# Patient Record
Sex: Male | Born: 1965 | Race: White | Hispanic: No | Marital: Married | Smoking: Never smoker
Health system: Southern US, Community
[De-identification: ages and names within clinical notes are randomized; demographics above are authoritative.]

## PROBLEM LIST (undated history)

## (undated) DIAGNOSIS — Z87440 Personal history of urinary (tract) infections: Secondary | ICD-10-CM

## (undated) DIAGNOSIS — J189 Pneumonia, unspecified organism: Secondary | ICD-10-CM

## (undated) DIAGNOSIS — K602 Anal fissure, unspecified: Secondary | ICD-10-CM

## (undated) DIAGNOSIS — I10 Essential (primary) hypertension: Secondary | ICD-10-CM

## (undated) DIAGNOSIS — M810 Age-related osteoporosis without current pathological fracture: Secondary | ICD-10-CM

## (undated) DIAGNOSIS — T7840XA Allergy, unspecified, initial encounter: Secondary | ICD-10-CM

## (undated) DIAGNOSIS — K648 Other hemorrhoids: Secondary | ICD-10-CM

## (undated) DIAGNOSIS — Z87898 Personal history of other specified conditions: Secondary | ICD-10-CM

## (undated) DIAGNOSIS — N200 Calculus of kidney: Secondary | ICD-10-CM

## (undated) DIAGNOSIS — Z6740 Type O blood, Rh positive: Secondary | ICD-10-CM

## (undated) DIAGNOSIS — B019 Varicella without complication: Secondary | ICD-10-CM

## (undated) DIAGNOSIS — E559 Vitamin D deficiency, unspecified: Secondary | ICD-10-CM

## (undated) DIAGNOSIS — Z9289 Personal history of other medical treatment: Secondary | ICD-10-CM

## (undated) HISTORY — DX: Vitamin D deficiency, unspecified: E55.9

## (undated) HISTORY — DX: Anal fissure, unspecified: K60.2

## (undated) HISTORY — DX: Allergy, unspecified, initial encounter: T78.40XA

## (undated) HISTORY — DX: Calculus of kidney: N20.0

## (undated) HISTORY — DX: Type O blood, Rh positive: Z67.40

## (undated) HISTORY — DX: Personal history of urinary (tract) infections: Z87.440

## (undated) HISTORY — DX: Age-related osteoporosis without current pathological fracture: M81.0

## (undated) HISTORY — DX: Personal history of other medical treatment: Z92.89

## (undated) HISTORY — DX: Varicella without complication: B01.9

## (undated) HISTORY — DX: Other hemorrhoids: K64.8

## (undated) HISTORY — DX: Essential (primary) hypertension: I10

## (undated) HISTORY — DX: Personal history of other specified conditions: Z87.898

---

## 2003-02-02 DIAGNOSIS — K602 Anal fissure, unspecified: Secondary | ICD-10-CM

## 2003-02-02 HISTORY — DX: Anal fissure, unspecified: K60.2

## 2004-02-02 HISTORY — PX: ANAL FISTULOTOMY: SHX6423

## 2016-02-02 DIAGNOSIS — E559 Vitamin D deficiency, unspecified: Secondary | ICD-10-CM

## 2016-02-02 DIAGNOSIS — N200 Calculus of kidney: Secondary | ICD-10-CM

## 2016-02-02 HISTORY — DX: Vitamin D deficiency, unspecified: E55.9

## 2016-02-02 HISTORY — DX: Calculus of kidney: N20.0

## 2016-05-25 ENCOUNTER — Encounter (INDEPENDENT_AMBULATORY_CARE_PROVIDER_SITE_OTHER): Payer: Self-pay

## 2016-05-25 ENCOUNTER — Ambulatory Visit (INDEPENDENT_AMBULATORY_CARE_PROVIDER_SITE_OTHER): Payer: 59 | Admitting: Nurse Practitioner

## 2016-05-25 ENCOUNTER — Encounter: Payer: Self-pay | Admitting: Nurse Practitioner

## 2016-05-25 VITALS — BP 152/90 | HR 98 | Ht 70.0 in | Wt 180.4 lb

## 2016-05-25 DIAGNOSIS — K603 Anal fistula: Secondary | ICD-10-CM

## 2016-05-25 MED ORDER — NA SULFATE-K SULFATE-MG SULF 17.5-3.13-1.6 GM/177ML PO SOLN: 1.0000 | Freq: Once | ORAL | 0 refills | Status: AC

## 2016-05-25 MED ORDER — CIPROFLOXACIN HCL 500 MG PO TABS: 500.0000 mg | ORAL_TABLET | Freq: Two times a day (BID) | ORAL | 0 refills | Status: DC

## 2016-05-25 MED ORDER — METRONIDAZOLE 500 MG PO TABS: 500.0000 mg | ORAL_TABLET | Freq: Three times a day (TID) | ORAL | 0 refills | Status: DC

## 2016-05-25 NOTE — Patient Instructions (Addendum)
If you are age 51 or older, your body mass index should be between 23-30. Your Body mass index is 25.88 kg/m. If this is out of the aforementioned range listed, please consider follow up with your Primary Care Provider.  If you are age 66 or younger, your body mass index should be between 19-25. Your Body mass index is 25.88 kg/m. If this is out of the aformentioned range listed, please consider follow up with your Primary Care Provider.   You have been scheduled for a colonoscopy. Please follow written instructions given to you at your visit today.  Please pick up your prep supplies at the pharmacy within the next 1-3 days. If you use inhalers (even only as needed), please bring them with you on the day of your procedure. Your physician has requested that you go to www.startemmi.com and enter the access code given to you at your visit today. This web site gives a general overview about your procedure. However, you should still follow specific instructions given to you by our office regarding your preparation for the procedure.   You have been scheduled for an MRI at 06/05/16 on [facility]. Your appointment time is 1000 am. Please arrive 15 minutes prior to your appointment time for registration purposes. In addition, if you have any metal in your body, have a pacemaker or defibrillator, please be sure to let your ordering physician know. This test typically takes 45 minutes to 1 hour to complete.  We have sent the following medications to your pharmacy for you to pick up at your convenience: Cipro Flagyl  STOP Preparation H.  Follow up appointment 06/15/16 at 3 pm.  Thank you for choosing me and [facility].  Willette Cluster, NP

## 2016-05-25 NOTE — Progress Notes (Addendum)
HPI: Patient is a 51 year old male who recently relocated from [place] to [place]. He is here for evaluation of perianal discomfort and drainage. 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 It is hard for him to recall the details but he did have a colonoscopy around that time as there was a concern for Crohn's disease. It does sound like Crohn's was ultimately excluded, patient had no further problems until just recently. He has had some hemorrhoidal flares off and on but several days ago developed perianal discomfort, itching and mild rectal bleeding. Patient used medicated wipes and Preparation H cream externally but symptoms did not improve. A few days ago while sitting in a meeting patient had a lot of mucoid discharge from the rectum which saturated his pants. Through all of this patient has not had any bowel changes. No constipation. No abdominal pain. Weight is overall stable. No family history of inflammatory bowel disease. Patient has no arthralgias or skin rashes.   Past Medical History:  Diagnosis Date  . Anal fissure     Past Surgical History:  Procedure Laterality Date  . ANAL FISTULOTOMY  2008   Family History  Problem Relation Age of Onset  . Prostate cancer Father   . Diverticulitis Father     part of colon removed  . Hypertension Brother   . Other Maternal Grandmother     died at 49  . Brain cancer Maternal Grandfather   . Diverticulitis Paternal Grandmother   . Colon cancer Neg Hx   . Stomach cancer Neg Hx   . Rectal cancer Neg Hx   . Esophageal cancer Neg Hx   . Liver cancer Neg Hx    Social History  Substance Use Topics  . Smoking status: Never Smoker  . Smokeless tobacco: Never Used  . Alcohol use Yes     Comment: has one drink per week   No current outpatient prescriptions on file.   No current facility-administered medications for this visit.    Allergies    Allergen Reactions  . Penicillins Rash     Review of Systems: All systems reviewed and negative except where noted in HPI.    Physical Exam: BP (!) 152/90   Pulse 98   Ht  (1.778 m)   Wt 180 lb 6 oz (81.8 kg)   SpO2 99%   BMI 25.88 kg/m  Constitutional:  Well-developed, white male male in no acute distress. Psychiatric: Normal mood and affect. Behavior is normal. EENT:  Conjunctivae are normal. No scleral icterus. Neck supple.  Cardiovascular: Normal rate, regular rhythm.  Pulmonary/chest: Effort normal and breath sounds normal. No wheezing, rales or rhonchi. Abdominal: Soft, nondistended, nontender. Bowel sounds active throughout. There are no masses palpable. No hepatomegaly. Rectal: buttocks / perineum with mild erythema /rash. Purulent perianal drainage , most abundant in area of anterior midline. Possibly a small fistula in perineal raphe, couldn't express anything the area . No fluctuance or signs of abscess Extremities: no edema Lymphadenopathy: No cervical adenopathy noted. Neurological: Alert and oriented to person place and time. Skin: Skin is warm and dry. No rashes noted.   ASSESSMENT AND PLAN:  51 year old male with perianal discomfort, scant bleeding,  moderate purulent rectal discharge and possible fistula in perineal raphe. Patient gives a history of a fistulotomy approximately 10 years ago in [place]. Sounds like Crohn's was rule out and he has been asymptomatic until just recently. No associated  bowel changes.  -requesting surgeon's and GI records from [place], hopefully they are still available after 10 years -Atypical presentation but I am still concerned about the possibility of Crohn's,especially given hx of a fistulotomy. Will obtain MRI of pelvis to evaluate for fistula.  -Cipro / flagyl. -Colonoscopy in next 6-8 weeks. Even if MRI negative for fistula and doesn't appear to have Crohn's patient still need colon cancer screening. The risks and  benefits of the procedure were discussed and the patient agrees to proceed.    Willette Cluster, NP  05/25/2016, 11:24 AM  Addendum: Reviewed and agree with initial management. I did see the patient today is patent and the assessment and plan. Beverley Fiedler, MD

## 2016-05-27 ENCOUNTER — Telehealth: Payer: Self-pay | Admitting: Nurse Practitioner

## 2016-05-27 NOTE — Telephone Encounter (Signed)
patient states that he was told to call in with updated past GI info. Dr. Roma Schanz is now with [facility] in [place]. [address] [place] [address] P: [phone]  Patient states that he was prescribed metronidazole and  cipro and he says that he is now having headaches.

## 2016-05-27 NOTE — Telephone Encounter (Signed)
Patient started on his Cipro and Flagyl Wednesday 05/26/16. He has noticed a headache that comes in correlation with the dosing of the antibiotics. He says it is not a bad headache and there are no other symptoms. He asks if he can take Tylenol if he has another headache. Discussed avoiding nsaid's, staying hydrated and pollen allergies. He will try Tylenol. He will call back if he acutely worsens or fails to improve.

## 2016-05-28 NOTE — Addendum Note (Signed)
Addended by: Beverley Fiedler on: 05/28/2016 10:39 AM   Modules accepted: Level of Service

## 2016-05-31 NOTE — Telephone Encounter (Signed)
Beth, CMA Malachi Bonds working on getting records. Can you pass along this information so she knows where to send request? Thanks

## 2016-06-01 ENCOUNTER — Telehealth: Payer: Self-pay | Admitting: Internal Medicine

## 2016-06-01 NOTE — Telephone Encounter (Signed)
Pt states he can have MRI at [facility] for 1600.00 less. Requests order be faxed there to see when they could schedule him. Pt did not want appt at [facility] cancelled until he makes sure he can be scheduled at [facility]. Orders faxed per pt request.

## 2016-06-01 NOTE — Telephone Encounter (Signed)
Called Rosann Auerbach and they states the MRI has auth for [employer] [facility]. MR is currently scheduled at [facility] with the same auth number. Called and left message for pt to call back to see what is actually going on. [facility] states they do not have him scheduled, state they do not schedule the appt until they have a physician's order. Will wait for pt to call back.

## 2016-06-02 NOTE — Telephone Encounter (Signed)
Left message for pt that the orders had been faxed 3 times yesterday but did not go through. Call placed to [facility] and they stated that their fax machine was down and had been for several days. Orders refaxed today and pt aware.

## 2016-06-02 NOTE — Telephone Encounter (Signed)
Patient states that [facility] told him that they do not have orders for this. Please refax. Best # to reach patient [phone]

## 2016-06-05 ENCOUNTER — Ambulatory Visit (HOSPITAL_COMMUNITY): Admission: RE | Admit: 2016-06-05 | Payer: PRIVATE HEALTH INSURANCE | Source: Ambulatory Visit

## 2016-06-08 NOTE — Telephone Encounter (Signed)
OV faxed

## 2016-06-08 NOTE — Telephone Encounter (Signed)
[facility] states that she needs OV notes regarding this. Patient is coming in today. Best P:[phone] and Fax: [phone]

## 2016-06-10 ENCOUNTER — Telehealth: Payer: Self-pay

## 2016-06-10 NOTE — Telephone Encounter (Signed)
Left message for pt to call back regarding MRI results. Per Dr. Rhea BeltonPyrtle MRI does not show abscess or definite fistula. No overt colitis. He feels that pt had an abscess cavity which drained spontaneously and decompressed itself. Colonoscopy is recommended for completeness.

## 2016-06-11 NOTE — Telephone Encounter (Signed)
Spoke with pt and he is aware. Pt has colon scheduled and knows to keep this appt. Pt has OV scheduled for Tuesday and will keep this appt. He states he still has some "moistness in the rectal area." He is wearing a pad but does not really see drainage.

## 2016-06-11 NOTE — Telephone Encounter (Signed)
[facility]. Best call back # [phone].

## 2016-06-15 ENCOUNTER — Ambulatory Visit (INDEPENDENT_AMBULATORY_CARE_PROVIDER_SITE_OTHER): Payer: PRIVATE HEALTH INSURANCE | Admitting: Nurse Practitioner

## 2016-06-15 ENCOUNTER — Encounter: Payer: Self-pay | Admitting: Nurse Practitioner

## 2016-06-15 VITALS — BP 160/94 | HR 80 | Ht 70.0 in | Wt 172.4 lb

## 2016-06-15 DIAGNOSIS — K610 Anal abscess: Secondary | ICD-10-CM

## 2016-06-15 NOTE — Patient Instructions (Signed)
If you are age 51 or older, your body mass index should be between 23-30. Your Body mass index is 24.73 kg/m. If this is out of the aforementioned range listed, please consider follow up with your Primary Care Provider.  If you are age 51 or younger, your body mass index should be between 19-25. Your Body mass index is 24.73 kg/m. If this is out of the aformentioned range listed, please consider follow up with your Primary Care Provider.   Keep colonoscopy appointment on 07/02/16 at 200 pm.  Thank you for choosing me and [facility].  Willette ClusterPaula Guenther, NP

## 2016-06-18 ENCOUNTER — Ambulatory Visit: Payer: Self-pay | Admitting: Gastroenterology

## 2016-06-18 NOTE — Progress Notes (Addendum)
     HPI: Patient is a 51 yo male who I saw late April for possible perianal fistula. He gave a history of a perianal fistula requiring a fist[place] 10 [place] ago [place]. We were unable to obtain GI records from [place] and patient didn't have any luck obtaining them either. When I saw him patient complained of perianal discomfort with recent episode of significant amount of purulent looking drainage which seemed to be coming from a small perianal lesion. On exam he did in fact have purulent looking perianal drainage coming from a tiny area in perineal raphe. He had no rectal bleeding, bowel changes, abdominal pain or fevers. We obtained an MRI of the pelvis which was actually didn't show a fistula but did some some thickening at 11 o'clock at level of introitus. While awaiting the MRI he was treated with cipro / flagyl and is here for follow up.   Alinda Moneyony feels the antibiotics have definitely helped though the drainage hasn't totally resolved. He still notices dampness in perianal area during the day. The drainage doesn't really have a color nor is it malodorous. No rectal pain and bowels are still moving normally.  No skin rashes or arthralgias. No urinary symptoms    Past Medical History:  Diagnosis Date  . Anal fissure     Patient's surgical history, family medical history, social history, medications and allergies were all reviewed in Epic    Physical Exam: BP (!) 160/94   Pulse 80   Ht 5\' 10"  (1.778 m)   Wt 172 lb 6 oz (78.2 kg)   BMI 24.73 kg/m   GENERAL: pleasant well developed white male in NAD PSYCH: :Pleasant, cooperative, normal affect EENT:conjunctiva pink, mucous membranes moist, neck supple without masses CARDIAC:  RRR, no peripheral edema PULM: Normal respiratory effort ABDOMEN:  soft, nontender, nondistended, no obvious masses,   normal bowel sounds RECTAL: no purulent drainage. No perianal fullness / fluctuance. He is still significantly tender at 12  o' clock. There is a small, shallow cratered area there which is non-draining.  SKIN:  turgor, no lesions seen Musculoskeletal:  Normal muscle tone, normal strength NEURO: Alert and oriented x 3, no focal neurologic deficits    ASSESSMENT and PLAN:  521. 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 He described a fist[place]otomy 10 [place] ago in [place]. Sounds like Crohn's was ultimately excluded at that time.  Recent MRI of pelvis didn't demonstrate a fistula, only an area of thickening at 11 o'clock at the level of the introitus. It is possible that patient had a perianal abscess which spontaneously ruptured a few days prior to seeing me. The drainage has significantly improved though not resolved after a course of cipro and flagyl. He is still quite tender in the same area in perineal raphe. Bowel movements remain completely normal. At this point Crohn's seems unlikely -At this point we will proceed with the scheduled colonoscopy for colon cancer screening and for further evaluation of his ongoing symptoms. The risks and benefits of the procedure were discussed and the patient agrees to proceed.  -Patient will call in the interim if he develops fevers / chills or increasing perianal discomfort or swelling -We will continue to try and get [place]   Willette ClusterPaula Lorris Carducci , NP 06/18/2016, 11:28 PM   Addendum: Reviewed and agree with management. Pyrtle, Carie CaddyJay M, MD

## 2016-06-24 ENCOUNTER — Encounter: Payer: Self-pay | Admitting: Internal Medicine

## 2016-07-02 ENCOUNTER — Ambulatory Visit (AMBULATORY_SURGERY_CENTER): Payer: PRIVATE HEALTH INSURANCE | Admitting: Internal Medicine

## 2016-07-02 ENCOUNTER — Telehealth: Payer: Self-pay | Admitting: Internal Medicine

## 2016-07-02 ENCOUNTER — Encounter: Payer: Self-pay | Admitting: Internal Medicine

## 2016-07-02 VITALS — BP 138/89 | HR 79 | Temp 98.9°F | Resp 11 | Ht 70.0 in | Wt 172.0 lb

## 2016-07-02 DIAGNOSIS — Z1211 Encounter for screening for malignant neoplasm of colon: Secondary | ICD-10-CM | POA: Diagnosis present

## 2016-07-02 DIAGNOSIS — Z1212 Encounter for screening for malignant neoplasm of rectum: Secondary | ICD-10-CM | POA: Diagnosis not present

## 2016-07-02 HISTORY — PX: COLONOSCOPY: SHX174

## 2016-07-02 MED ORDER — SODIUM CHLORIDE 0.9 % IV SOLN: 500.0000 mL | INTRAVENOUS | Status: DC

## 2016-07-02 NOTE — Progress Notes (Signed)
To recovery, report to Jones, RN, VSS 

## 2016-07-02 NOTE — Telephone Encounter (Signed)
Spoke with patient and explained that he needs to mix prep with water.  Suggested that he Take sips of sprite or another clear liquid while taking the prep.  Also suggested that he use a straw.  He also plans to start before the start time on his instructions to give himself more time.

## 2016-07-02 NOTE — Progress Notes (Signed)
Pt's states no medical or surgical changes since previsit or office visit.  No egg or soy allergy  

## 2016-07-02 NOTE — Patient Instructions (Signed)
YOU HAD AN ENDOSCOPIC PROCEDURE TODAY AT THE [facility]:   Refer to the procedure report that was given to you for any specific questions about what was found during the examination.  If the procedure report does not answer your questions, please call your gastroenterologist to clarify.  If you requested that your care partner not be given the details of your procedure findings, then the procedure report has been included in a sealed envelope for you to review at your convenience later.  YOU SHOULD EXPECT: Some feelings of bloating in the abdomen. Passage of more gas than usual.  Walking can help get rid of the air that was put into your GI tract during the procedure and reduce the bloating. If you had a lower endoscopy (such as a colonoscopy or flexible sigmoidoscopy) you may notice spotting of blood in your stool or on the toilet paper. If you underwent a bowel prep for your procedure, you may not have a normal bowel movement for a few days.  Please Note:  You might notice some irritation and congestion in your nose or some drainage.  This is from the oxygen used during your procedure.  There is no need for concern and it should clear up in a day or so.  SYMPTOMS TO REPORT IMMEDIATELY:   Following lower endoscopy (colonoscopy or flexible sigmoidoscopy):  Excessive amounts of blood in the stool  Significant tenderness or worsening of abdominal pains  Swelling of the abdomen that is new, acute  Fever of 100F or higher  For urgent or emergent issues, a gastroenterologist can be reached at any hour by calling [phone].   DIET:  We do recommend a small meal at first, but then you may proceed to your regular diet.  Drink plenty of fluids but you should avoid alcoholic beverages for 24 hours.  ACTIVITY:  You should plan to take it easy for the rest of today and you should NOT DRIVE or use heavy machinery until tomorrow (because of the sedation medicines used during the test).     FOLLOW UP: Our staff will call the number listed on your records the next business day following your procedure to check on you and address any questions or concerns that you may have regarding the information given to you following your procedure. If we do not reach you, we will leave a message.  However, if you are feeling well and you are not experiencing any problems, there is no need to return our call.  We will assume that you have returned to your regular daily activities without incident.  If any biopsies were taken you will be contacted by phone or by letter within the next 1-3 weeks.  Please call us at [phone] if you have not heard about the biopsies in 3 weeks.   Repeat Colonoscopy screening in 10 years.   SIGNATURES/CONFIDENTIALITY: You and/or your care partner have signed paperwork which will be entered into your electronic medical record.  These signatures attest to the fact that that the information above on your After Visit Summary has been reviewed and is understood.  Full responsibility of the confidentiality of this discharge information lies with you and/or your care-partner. 

## 2016-07-02 NOTE — Op Note (Signed)
[facility] Patient Name: Alejandro English Procedure Date: 07/02/2016 3:08 PM MRN: [id number] Endoscopist: Beverley Fiedler , MD Age: 51 Referring MD:  Date of Birth: 1965/02/11 Gender: Male Account #: [id number] Procedure:                Colonoscopy Indications:              Screening for colorectal malignant neoplasm,                            history of rectal perirectal abscess treated to                            resolution with antibiotics, history of perirectal                            infection and fistulotomy approximately 10 yrs ago                            in [place] Medicines:                Monitored Anesthesia Care Procedure:                Pre-Anesthesia Assessment:                           - Prior to the procedure, a History and Physical                            was performed, and patient medications and                            allergies were reviewed. The patient's tolerance of                            previous anesthesia was also reviewed. The risks                            and benefits of the procedure and the sedation                            options and risks were discussed with the patient.                            All questions were answered, and informed consent                            was obtained. Prior Anticoagulants: The patient has                            taken no previous anticoagulant or antiplatelet                            agents. ASA Grade Assessment: II - A patient with  mild systemic disease. After reviewing the risks                            and benefits, the patient was deemed in                            satisfactory condition to undergo the procedure.                           After obtaining informed consent, the colonoscope                            was passed under direct vision. Throughout the                            procedure, the patient's blood pressure, pulse, and            oxygen saturations were monitored continuously. The                            Colonoscope was introduced through the anus and                            advanced to the the terminal ileum. The colonoscopy                            was performed without difficulty. The patient                            tolerated the procedure well. The quality of the                            bowel preparation was good. The terminal ileum,                            ileocecal valve, appendiceal orifice, and rectum                            were photographed. Scope In: 3:29:13 PM Scope Out: 3:41:53 PM Scope Withdrawal Time: 0 hours 8 minutes 0 seconds  Total Procedure Duration: 0 hours 12 minutes 40 seconds  Findings:                 The terminal ileum appeared normal.                           The entire examined colon appeared normal on direct                            and retroflexion views.                           There is no endoscopic evidence of inflammatory                            bowel disease  in the examined terminal ileum or the                            entire colon. Complications:            No immediate complications. Estimated Blood Loss:     Estimated blood loss: none. Estimated blood loss:                            none. Impression:               - The examined portion of the ileum was normal.                           - The entire examined colon is normal on direct and                            retroflexion views.                           - No evidence of IBD.                           - No specimens collected. Recommendation:           - Patient has a contact number available for                            emergencies. The signs and symptoms of potential                            delayed complications were discussed with the                            patient. Return to normal activities tomorrow.                            Written discharge instructions were  provided to the                            patient.                           - Resume previous diet.                           - Continue present medications.                           - Repeat colonoscopy in 10 years for screening                            purposes. [name], MD 07/02/2016 3:50:41 PM This report has been signed electronically.

## 2016-07-05 ENCOUNTER — Telehealth: Payer: Self-pay | Admitting: *Deleted

## 2016-07-05 NOTE — Telephone Encounter (Signed)
Left message on f/u call 

## 2016-07-05 NOTE — Telephone Encounter (Signed)
  Follow up Call-  Call back number 07/02/2016  Post procedure Call Back phone  # [phone]  Permission to leave phone message Yes     Patient questions:  Do you have a fever, pain , or abdominal swelling? No. Pain Score  0 *  Have you tolerated food without any problems? Yes.    Have you been able to return to your normal activities? Yes.    Do you have any questions about your discharge instructions: Diet   No. Medications  No. Follow up visit  No.  Do you have questions or concerns about your Care? No.  Actions: * If pain score is 4 or above: No action needed, pain <4.

## 2016-07-09 ENCOUNTER — Telehealth: Payer: Self-pay | Admitting: *Deleted

## 2016-07-13 NOTE — Telephone Encounter (Signed)
No answer. Left message to call if questions or concerns. 

## 2016-08-01 LAB — HM COLONOSCOPY

## 2016-09-17 ENCOUNTER — Encounter: Payer: Self-pay | Admitting: Physician Assistant

## 2016-09-17 ENCOUNTER — Telehealth: Payer: Self-pay | Admitting: Internal Medicine

## 2016-09-17 ENCOUNTER — Ambulatory Visit (INDEPENDENT_AMBULATORY_CARE_PROVIDER_SITE_OTHER): Payer: 59 | Admitting: Physician Assistant

## 2016-09-17 ENCOUNTER — Encounter (INDEPENDENT_AMBULATORY_CARE_PROVIDER_SITE_OTHER): Payer: Self-pay

## 2016-09-17 VITALS — BP 170/100 | HR 76 | Ht 69.5 in | Wt 178.4 lb

## 2016-09-17 DIAGNOSIS — R198 Other specified symptoms and signs involving the digestive system and abdomen: Secondary | ICD-10-CM | POA: Diagnosis not present

## 2016-09-17 DIAGNOSIS — K648 Other hemorrhoids: Secondary | ICD-10-CM | POA: Diagnosis not present

## 2016-09-17 MED ORDER — METRONIDAZOLE 500 MG PO TABS: 500.0000 mg | ORAL_TABLET | Freq: Three times a day (TID) | ORAL | 0 refills | Status: DC

## 2016-09-17 MED ORDER — CIPROFLOXACIN HCL 500 MG PO TABS: 500.0000 mg | ORAL_TABLET | Freq: Two times a day (BID) | ORAL | 0 refills | Status: DC

## 2016-09-17 MED ORDER — HYDROCORTISONE ACETATE 25 MG RE SUPP: 25.0000 mg | Freq: Two times a day (BID) | RECTAL | 1 refills | Status: DC

## 2016-09-17 NOTE — Progress Notes (Addendum)
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   Please recall patient was initially seen 05/25/16 with history of a fistulotomy 10 years prior, he was treated with Ciprofloxacin and Flagyl and a pelvic MRI was ordered. Patient had follow-up 06/15/16 and had some improvement on the Ciprofloxacin and Flagyl. The MRI had showed no fistula but some thickening at "11:00 at level of introitus". Patient reported antibiotics helped the drainage but it had not resolved. At that time patient was scheduled for a colonoscopy. Patient's previous records from [place] had not been able to be obtained.   Patient had colonoscopy 07/02/16 by Dr. Rhea Belton, this was normal.   Today, the patient presents to clinic and tells me that he was doing well after time of his colonoscopy until Tuesday of this week, 09/14/16 when he noticed some drainage of a clear brown-ish looking material, this was better on Wednesday and was worse again yesterday and is better today. He has also had some discomfort as far as itchiness in his rectum. Patient does tell me today that he is a runner and did have a long run on Wednesday. He typically runs 3 days a week and will ride his bike in between. No obvious rectal pain or rectal bleeding.   Patient denies fever, chills, change in bowel habits, anorexia, nausea, vomiting or symptoms that awaken him at night.  Past Medical History:  Diagnosis Date  . Anal fissure     Past Surgical History:  Procedure Laterality Date  . ANAL FISTULOTOMY  2008    No current outpatient prescriptions on file.   Current Facility-Administered Medications  Medication Dose Route Frequency Provider Last Rate Last Dose  . 0.9 %  sodium chloride infusion  500 mL Intravenous Continuous Pyrtle, Carie Caddy, MD        Allergies as of 09/17/2016 - Review  Complete 07/02/2016  Allergen Reaction Noted  . Penicillins Rash 05/25/2016    Family History  Problem Relation Age of Onset  . Prostate cancer Father   . Diverticulitis Father        part of colon removed  . Hypertension Brother   . Other Maternal Grandmother        died at 79  . Brain cancer Maternal Grandfather   . Diverticulitis Paternal Grandmother   . Colon cancer Neg Hx   . Stomach cancer Neg Hx   . Rectal cancer Neg Hx   . Esophageal cancer Neg Hx   . Liver cancer Neg Hx   . Colon polyps Neg Hx     Social History   Social History  . Marital status: Single    Spouse name: N/A  . Number of children: 2  . Years of education: N/A   Occupational History  . Geologist, engineering    Social History Main Topics  . Smoking status: Never Smoker  . Smokeless tobacco: Never Used  . Alcohol use Yes     Comment: has one drink per week  . Drug use: No  . Sexual activity: Not on file   Other Topics Concern  . Not on file   Social History Narrative  . No narrative on file    Review of Systems:    Constitutional: No weight loss, fever or chills Cardiovascular: No chest pain Respiratory: No SOB Gastrointestinal: See HPI and otherwise negative  Physical Exam:  Vital signs: BP (!) 170/100 (BP Location: Left Arm, Patient Position: Sitting, Cuff Size: Normal)   Pulse 76   Ht 5' 9.5" (1.765 m) Comment: height measured without shoes  Wt 178 lb 6 oz (80.9 kg)   BMI 25.96 kg/m    Constitutional:   Pleasant Caucasian male appears to be in NAD, Well developed, Well nourished, alert and cooperative Head:  Normocephalic and atraumatic. Eyes:   PEERL, EOMI. No icterus. Conjunctiva pink. Ears:  Normal auditory acuity. Neck:  Supple Throat: Oral cavity and pharynx without inflammation, swelling or lesion.  Respiratory: Respirations even and unlabored. Lungs clear to auscultation bilaterally.   No wheezes, crackles, or rhonchi.  Cardiovascular: Normal S1, S2. No MRG. Regular  rate and rhythm. No peripheral edema, cyanosis or pallor.  Gastrointestinal:  Soft, nondistended, nontender. No rebound or guarding. Normal bowel sounds. No appreciable masses or hepatomegaly. Rectal:  External exam: Moisture and some erythema around the rectum; Anoscopy: Patient tender with insertion of scope, inflamed internal hemorrhoids, grade 1/2, no sign of abscess and no pinpoint tenderness, no fistula, no drainage Psychiatric:  Demonstrates good judgement and reason without abnormal affect or behaviors.  No recent labs or imaging.  Assessment: 1. Internal hemorrhoids: Seen at time of exam today, inflamed, question whether drainage is just fecal seepage 2. 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 Discussed with patient today that symptoms of drainage could be stool seepage due to inflamed hemorrhoids. Recommend that we treat these first. 2. Prescribed Hydrocortisone suppositories twice a day 7 days #14 with one refill 3. The patient is allergic to penicillins. Sent in an emergency script for ciprofloxacin 500 mg twice a day 10 days and Flagyl 500 mg 3 times a day 10 days. The patient was advised not to start these antibiotics unless his symptoms increase or worsen i.e. he has rectal bleeding, increased rectal pain, increased drainage, fever or chills 4. Reviewed anti-hemorrhoid measures. Recommended sitz baths 2-3 times daily 5. Patient should keep stool solid and formed 6. Patient to follow in clinic in 2-3 weeks with an APP or Dr. Marlan Palau, PA-C [facility] 09/17/2016, 2:02 PM  Cc: No ref. provider found   Addendum: Reviewed and agree with management. Pyrtle, Carie Caddy, MD

## 2016-09-17 NOTE — Telephone Encounter (Signed)
Pt states he started having rectal drainage again on Tuesday. Reports Thursday his rectal area felt raw and itchy. Pt was treated for rectal abscess a few months ago.Pt scheduled to see Hyacinth Meeker PA today at 2:15pm. Pt aware of appt.

## 2016-09-17 NOTE — Patient Instructions (Addendum)
If you are age 51 or older, your body mass index should be between 23-30. Your Body mass index is 25.96 kg/m. If this is out of the aforementioned range listed, please consider follow up with your Primary Care Provider.  If you are age 76 or younger, your body mass index should be between 19-25. Your Body mass index is 25.96 kg/m. If this is out of the aformentioned range listed, please consider follow up with your Primary Care Provider.   We have sent the following medications to your pharmacy for you to pick up at your convenience: Anusol Flagyl Cipro  DO NOT start antibiotics unless symptoms worsen ie. Rectal bleeding, increased pain, fever/chills.   How to Take a Sitz Bath A sitz bath is a warm water bath that is taken while you are sitting down. The water should only come up to your hips and should cover your buttocks. Your health care provider may recommend a sitz bath to help you:  Clean the lower part of your body, including your genital area.  With itching.  With pain.  With sore muscles or muscles that tighten or spasm.  How to take a sitz bath Take 3-4 sitz baths per day or as told by your health care provider. 1. Partially fill a bathtub with warm water. You will only need the water to be deep enough to cover your hips and buttocks when you are sitting in it. 2. If your health care provider told you to put medicine in the water, follow the directions exactly. 3. Sit in the water and open the tub drain a little. 4. Turn on the warm water again to keep the tub at the correct level. Keep the water running constantly. 5. Soak in the water for 15-20 minutes or as told by your health care provider. 6. After the sitz bath, pat the affected area dry first. Do not rub it. 7. Be careful when you stand up after the sitz bath because you may feel dizzy.  Contact a health care provider if:  Your symptoms get worse. Do not continue with sitz baths if your symptoms get worse.  You  have new symptoms. Do not continue with sitz baths until you talk with your health care provider. This information is not intended to replace advice given to you by your health care provider. Make sure you discuss any questions you have with your health care provider. Document Released: 10/11/2003 Document Revised: 06/18/2015 Document Reviewed: 01/16/2014 Elsevier Interactive Patient Education  2018 [employer].  Follow up with Dr. Rhea Belton on 11/17/16 at 8:30 a.m.  Thank you for choosing me and [facility].   Hyacinth Meeker, PA-C

## 2016-11-05 ENCOUNTER — Encounter: Payer: Self-pay | Admitting: *Deleted

## 2016-11-17 ENCOUNTER — Ambulatory Visit: Payer: 59 | Admitting: Internal Medicine

## 2016-12-01 ENCOUNTER — Telehealth: Payer: Self-pay

## 2016-12-01 NOTE — Telephone Encounter (Signed)
Unable to reach patient at time of Pre Visit Call.   

## 2016-12-02 ENCOUNTER — Encounter: Payer: Self-pay | Admitting: Family Medicine

## 2016-12-02 ENCOUNTER — Ambulatory Visit (INDEPENDENT_AMBULATORY_CARE_PROVIDER_SITE_OTHER): Payer: 59 | Admitting: Family Medicine

## 2016-12-02 VITALS — BP 160/92 | HR 72 | Temp 98.7°F | Resp 20 | Ht 70.0 in | Wt 184.8 lb

## 2016-12-02 DIAGNOSIS — Z23 Encounter for immunization: Secondary | ICD-10-CM

## 2016-12-02 DIAGNOSIS — I10 Essential (primary) hypertension: Secondary | ICD-10-CM

## 2016-12-02 DIAGNOSIS — Z6826 Body mass index (BMI) 26.0-26.9, adult: Secondary | ICD-10-CM | POA: Diagnosis not present

## 2016-12-02 DIAGNOSIS — Z125 Encounter for screening for malignant neoplasm of prostate: Secondary | ICD-10-CM

## 2016-12-02 DIAGNOSIS — Z7689 Persons encountering health services in other specified circumstances: Secondary | ICD-10-CM

## 2016-12-02 DIAGNOSIS — E663 Overweight: Secondary | ICD-10-CM | POA: Insufficient documentation

## 2016-12-02 HISTORY — DX: Essential (primary) hypertension: I10

## 2016-12-02 LAB — CBC
HCT: 51 % (ref 39.0–52.0)
HEMOGLOBIN: 17.2 g/dL — AB (ref 13.0–17.0)
MCHC: 33.7 g/dL (ref 30.0–36.0)
MCV: 88.2 fl (ref 78.0–100.0)
PLATELETS: 166 10*3/uL (ref 150.0–400.0)
RBC: 5.79 Mil/uL (ref 4.22–5.81)
RDW: 13 % (ref 11.5–15.5)
WBC: 6.6 10*3/uL (ref 4.0–10.5)

## 2016-12-02 LAB — COMPREHENSIVE METABOLIC PANEL
ALBUMIN: 4.6 g/dL (ref 3.5–5.2)
ALT: 17 U/L (ref 0–53)
AST: 17 U/L (ref 0–37)
Alkaline Phosphatase: 69 U/L (ref 39–117)
BILIRUBIN TOTAL: 0.7 mg/dL (ref 0.2–1.2)
BUN: 16 mg/dL (ref 6–23)
CALCIUM: 9.8 mg/dL (ref 8.4–10.5)
CHLORIDE: 104 meq/L (ref 96–112)
CO2: 33 mEq/L — ABNORMAL HIGH (ref 19–32)
CREATININE: 1.02 mg/dL (ref 0.40–1.50)
GFR: 81.79 mL/min (ref >60.00)
Glucose, Bld: 76 mg/dL (ref 70–99)
Potassium: 4.6 mEq/L (ref 3.5–5.1)
Sodium: 142 mEq/L (ref 135–145)
Total Protein: 7.5 g/dL (ref 6.0–8.3)

## 2016-12-02 LAB — TSH: TSH: 1.91 u[IU]/mL (ref 0.35–4.50)

## 2016-12-02 LAB — PSA: PSA: 2.01 ng/mL (ref 0.10–4.00)

## 2016-12-02 LAB — HEMOGLOBIN A1C: HEMOGLOBIN A1C: 5.2 % (ref 4.6–6.5)

## 2016-12-02 MED ORDER — ZOSTER VAC RECOMB ADJUVANTED 50 MCG/0.5ML IM SUSR
0.5000 mL | Freq: Once | INTRAMUSCULAR | 1 refills | Status: AC
Start: 2016-12-02 — End: 2016-12-02

## 2016-12-02 NOTE — Progress Notes (Signed)
Patient ID: [name], male  DOB: [birth year], 51 y.o.   MRN: [id number] Patient Care Team    Relationship Specialty Notifications Start End  Ma Hillock, DO PCP - General Family Medicine  12/02/16     Chief Complaint  Patient presents with  . Establish Care    Subjective:  Sanad Fearnow is a 51 y.o.  male present for new patient establishment. All past medical history, surgical history, allergies, family history, immunizations, medications and social history were obtained and entered in the electronic medical record today. All recent labs, ED visits and hospitalizations within the last year were reviewed.  Patient presents for new patient establishment, recently moved from [place].  Elevated blood pressure: Patient reports he has had borderline blood pressure for the past few years. However when he had his colonoscopy and follow-up his blood pressure have been rather elevated and he was encouraged to be established with a PCP. He denies chest pain, shortness breath, lower extremity edema, dizziness or headaches. He does not monitor the salt content in his diet. He reports he is a daily runner, but has cut back over the last few weeks to months secondary to a mild back strain.   Depression screen PHQ 2/9 12/02/2016  Decreased Interest 0  Down, Depressed, Hopeless 0  PHQ - 2 Score 0   No flowsheet data found.    Current Exercise Habits: The patient does not participate in regular exercise at present Exercise limited by: orthopedic condition(s) Fall Risk  12/02/2016  Falls in the past year? No     Immunization History  Administered Date(s) Administered  . Influenza,inj,Quad PF,6+ Mos 12/02/2016    No exam data present  Past Medical History:  Diagnosis Date  . Allergy   . Anal fissure 2005  . Chickenpox   . H/O syncope   . History of UTI   . Internal hemorrhoids    follow by  Dr. Hilarie Fredrickson   Allergies  Allergen Reactions  . Penicillins Rash   Past  Surgical History:  Procedure Laterality Date  . ANAL FISTULOTOMY  2008  . COLONOSCOPY  07/02/2016   Normal, 10 year follow-up. Dr. Hilarie Fredrickson   Family History  Problem Relation Age of Onset  . Prostate cancer Father   . Diverticulitis Father        part of colon removed  . Hypertension Brother   . Other Maternal Grandmother        died at 34  . Brain cancer Maternal Grandfather   . Diverticulitis Paternal Grandmother   . Colon cancer Neg Hx   . Stomach cancer Neg Hx   . Rectal cancer Neg Hx   . Esophageal cancer Neg Hx   . Liver cancer Neg Hx   . Colon polyps Neg Hx    Social History   Social History  . Marital status: Single    Spouse name: N/A  . Number of children: 2  . Years of education: 67   Occupational History  . Artist    Social History Main Topics  . Smoking status: Never Smoker  . Smokeless tobacco: Never Used  . Alcohol use Yes     Comment: has one drink per week  . Drug use: No  . Sexual activity: No   Other Topics Concern  . Not on file   Social History Narrative   Single. Has 2 children. Works as a Pharmacist, hospital.   MBA degree.   Wears a bicycle helmet. Wears his  seatbelt.   Exercises routinely.      Allergies as of 12/02/2016      Reactions   Penicillins Rash      Medication List       Accurate as of 12/02/16  1:03 PM. Always use your most recent med list.          Zoster Vaccine Adjuvanted injection Commonly known as:  SHINGRIX Inject 0.5 mLs into the muscle once. , repeat injection in 2-6 months.       All past medical history, surgical history, allergies, family history, immunizations andmedications were updated in the EMR today and reviewed under the history and medication portions of their EMR.    No results found for this or any previous visit (from the past 2160 hour(s)).  Patient was never admitted.   ROS: 14 pt review of systems performed and negative (unless mentioned in an HPI)  Objective: BP (!) 160/92  (BP Location: Right Arm, Patient Position: Sitting, Cuff Size: Large) Comment (Cuff Size): manual  Pulse 72   Temp 98.7 F (37.1 C)   Resp 20   Ht '5\' 10"'$  (1.778 m)   Wt 184 lb 12 oz (83.8 kg)   SpO2 96%   BMI 26.51 kg/m  Gen: Afebrile. No acute distress. Nontoxic in appearance, well-developed, well-nourished,  pleasant Caucasian male. HENT: AT. [place]. Bilateral TM visualized and normal in appearance, normal external auditory canal. MMM, no oral lesions, adequate dentition. Bilateral nares within normal limits. Throat without erythema, ulcerations or exudates. no Cough on exam, no hoarseness on exam. Eyes:Pupils Equal Round Reactive to light, Extraocular movements intact,  Conjunctiva without redness, discharge or icterus. Neck/lymp/endocrine: Supple, no lymphadenopathy, no thyromegaly CV: RRR no murmur, no edema, +2/4 P posterior tibialis pulses. No carotid bruits. No JVD. Chest: CTAB, no wheeze, rhonchi or crackles. Normal Respiratory effort. Could Air movement. Abd: Soft. Flat. NTND. BS present. No Masses palpated. No hepatosplenomegaly.  Skin:  Warm and well-perfused. Skin intact. Neuro/Msk: Normal gait. PERLA. EOMi. Alert. Oriented x3.   Psych: Normal affect, dress and demeanor. Normal speech. Normal thought content and judgment.   Assessment/plan: Evaristo Tsuda is a 51 y.o. male present for establish care with elevated BP.  Influenza vaccine administered - Flu Vaccine QUAD 6+ mos PF IM (Fluarix Quad PF) Essential hypertension/BMI 26 - New-onset hypertension. Education was provided today on normal parameters of blood pressure. Discussed the need of medication bring down his blood pressure. Patient wishes he did not need to medication, but understands and is agreeable. - No laboratory results available in the system, we'll collect labs today for baseline kidney function, CBC, check for thyroid disorder causing hypertension and diabetes screening for elevated blood pressure. - We'll start  lisinopril 10 mg daily and follow-up in 2 weeks, after kidney function is received. - Discussed exercise and low-sodium diet in detail. Patient was given additional educational and low-sodium diet and hypertension. - CBC - Comp Met (CMET) - TSH - HgB A1c - Follow-up 2 weeks  Screening for prostate cancer - Family history, patient would like to have screening. No prior screening available for comparison. - PSA   Return in about 2 weeks (around 12/16/2016) for HTN'.  Greater than 45 minutes was spent with patient, greater than 50% of that time was spent face-to-face with patient counseling for new onset hypertension, and establishment of care/chart for patient.   Note is dictated utilizing voice recognition software. Although note has been proof read prior to signing, occasional typographical errors still can be  missed. If any questions arise, please do not hesitate to call for verification.  Electronically signed by: Howard Pouch, DO [place]

## 2016-12-02 NOTE — Patient Instructions (Signed)
Nice to meet you today.   We will call you with your labs once available. We will likely start lisinopril 10 mg daily to start for your blood pressure once labs are resulted. We will need to follow up with you in 2 weeks to recheck BP with a provider appt.     Low sodium diet encouraged.  Exercise > 150 minutes a week.     Hypertension Hypertension, commonly called high blood pressure, is when the force of blood pumping through the arteries is too strong. The arteries are the blood vessels that carry blood from the heart throughout the body. Hypertension forces the heart to work harder to pump blood and may cause arteries to become narrow or stiff. Having untreated or uncontrolled hypertension can cause heart attacks, strokes, kidney disease, and other problems. A blood pressure reading consists of a higher number over a lower number. Ideally, your blood pressure should be below 120/80. The first ("top") number is called the systolic pressure. It is a measure of the pressure in your arteries as your heart beats. The second ("bottom") number is called the diastolic pressure. It is a measure of the pressure in your arteries as the heart relaxes. What are the causes? The cause of this condition is not known. What increases the risk? Some risk factors for high blood pressure are under your control. Others are not. Factors you can change  Smoking.  Having type 2 diabetes mellitus, high cholesterol, or both.  Not getting enough exercise or physical activity.  Being overweight.  Having too much fat, sugar, calories, or salt (sodium) in your diet.  Drinking too much alcohol. Factors that are difficult or impossible to change  Having chronic kidney disease.  Having a family history of high blood pressure.  Age. Risk increases with age.  Race. You may be at higher risk if you are African-American.  Gender. Men are at higher risk than women before age 38. After age 64, women are at  higher risk than men.  Having obstructive sleep apnea.  Stress. What are the signs or symptoms? Extremely high blood pressure (hypertensive crisis) may cause:  Headache.  Anxiety.  Shortness of breath.  Nosebleed.  Nausea and vomiting.  Severe chest pain.  Jerky movements you cannot control (seizures).  How is this diagnosed? This condition is diagnosed by measuring your blood pressure while you are seated, with your arm resting on a surface. The cuff of the blood pressure monitor will be placed directly against the skin of your upper arm at the level of your heart. It should be measured at least twice using the same arm. Certain conditions can cause a difference in blood pressure between your right and left arms. Certain factors can cause blood pressure readings to be lower or higher than normal (elevated) for a short period of time:  When your blood pressure is higher when you are in a health care provider's office than when you are at home, this is called white coat hypertension. Most people with this condition do not need medicines.  When your blood pressure is higher at home than when you are in a health care provider's office, this is called masked hypertension. Most people with this condition may need medicines to control blood pressure.  If you have a high blood pressure reading during one visit or you have normal blood pressure with other risk factors:  You may be asked to return on a different day to have your blood pressure checked again.  You may be asked to monitor your blood pressure at home for 1 week or longer.  If you are diagnosed with hypertension, you may have other blood or imaging tests to help your health care provider understand your overall risk for other conditions. How is this treated? This condition is treated by making healthy lifestyle changes, such as eating healthy foods, exercising more, and reducing your alcohol intake. Your health care provider  may prescribe medicine if lifestyle changes are not enough to get your blood pressure under control, and if:  Your systolic blood pressure is above 130.  Your diastolic blood pressure is above 80.  Your personal target blood pressure may vary depending on your medical conditions, your age, and other factors. Follow these instructions at home: Eating and drinking  Eat a diet that is high in fiber and potassium, and low in sodium, added sugar, and fat. An example eating plan is called the DASH (Dietary Approaches to Stop Hypertension) diet. To eat this way: ? Eat plenty of fresh fruits and vegetables. Try to fill half of your plate at each meal with fruits and vegetables. ? Eat whole grains, such as whole wheat pasta, brown rice, or whole grain bread. Fill about one quarter of your plate with whole grains. ? Eat or drink low-fat dairy products, such as skim milk or low-fat yogurt. ? Avoid fatty cuts of meat, processed or cured meats, and poultry with skin. Fill about one quarter of your plate with lean proteins, such as fish, chicken without skin, beans, eggs, and tofu. ? Avoid premade and processed foods. These tend to be higher in sodium, added sugar, and fat.  Reduce your daily sodium intake. Most people with hypertension should eat less than 1,500 mg of sodium a day.  Limit alcohol intake to no more than 1 drink a day for nonpregnant women and 2 drinks a day for men. One drink equals 12 oz of beer, 5 oz of wine, or 1 oz of hard liquor. Lifestyle  Work with your health care provider to maintain a healthy body weight or to lose weight. Ask what an ideal weight is for you.  Get at least 30 minutes of exercise that causes your heart to beat faster (aerobic exercise) most days of the week. Activities may include walking, swimming, or biking.  Include exercise to strengthen your muscles (resistance exercise), such as pilates or lifting weights, as part of your weekly exercise routine. Try to  do these types of exercises for 30 minutes at least 3 days a week.  Do not use any products that contain nicotine or tobacco, such as cigarettes and e-cigarettes. If you need help quitting, ask your health care provider.  Monitor your blood pressure at home as told by your health care provider.  Keep all follow-up visits as told by your health care provider. This is important. Medicines  Take over-the-counter and prescription medicines only as told by your health care provider. Follow directions carefully. Blood pressure medicines must be taken as prescribed.  Do not skip doses of blood pressure medicine. Doing this puts you at risk for problems and can make the medicine less effective.  Ask your health care provider about side effects or reactions to medicines that you should watch for. Contact a health care provider if:  You think you are having a reaction to a medicine you are taking.  You have headaches that keep coming back (recurring).  You feel dizzy.  You have swelling in your ankles.  You have trouble with your vision. Get help right away if:  You develop a severe headache or confusion.  You have unusual weakness or numbness.  You feel faint.  You have severe pain in your chest or abdomen.  You vomit repeatedly.  You have trouble breathing. Summary  Hypertension is when the force of blood pumping through your arteries is too strong. If this condition is not controlled, it may put you at risk for serious complications.  Your personal target blood pressure may vary depending on your medical conditions, your age, and other factors. For most people, a normal blood pressure is less than 120/80.  Hypertension is treated with lifestyle changes, medicines, or a combination of both. Lifestyle changes include weight loss, eating a healthy, low-sodium diet, exercising more, and limiting alcohol. This information is not intended to replace advice given to you by your health  care provider. Make sure you discuss any questions you have with your health care provider. Document Released: 01/18/2005 Document Revised: 12/17/2015 Document Reviewed: 12/17/2015 Elsevier Interactive Patient Education  2018 Elsevier Inc.     Low-Sodium Eating Plan Sodium, which is an element that makes up salt, helps you maintain a healthy balance of fluids in your body. Too much sodium can increase your blood pressure and cause fluid and waste to be held in your body. Your health care provider or dietitian may recommend following this plan if you have high blood pressure (hypertension), kidney disease, liver disease, or heart failure. Eating less sodium can help lower your blood pressure, reduce swelling, and protect your heart, liver, and kidneys. What are tips for following this plan? General guidelines  Most people on this plan should limit their sodium intake to 1,500-2,000 mg (milligrams) of sodium each day. Reading food labels  The Nutrition Facts label lists the amount of sodium in one serving of the food. If you eat more than one serving, you must multiply the listed amount of sodium by the number of servings.  Choose foods with less than 140 mg of sodium per serving.  Avoid foods with 300 mg of sodium or more per serving. Shopping  Look for lower-sodium products, often labeled as "low-sodium" or "no salt added."  Always check the sodium content even if foods are labeled as "unsalted" or "no salt added".  Buy fresh foods. ? Avoid canned foods and premade or frozen meals. ? Avoid canned, cured, or processed meats  Buy breads that have less than 80 mg of sodium per slice. Cooking  Eat more home-cooked food and less restaurant, buffet, and fast food.  Avoid adding salt when cooking. Use salt-free seasonings or herbs instead of table salt or sea salt. Check with your health care provider or pharmacist before using salt substitutes.  Cook with plant-based oils, such as  canola, sunflower, or olive oil. Meal planning  When eating at a restaurant, ask that your food be prepared with less salt or no salt, if possible.  Avoid foods that contain MSG (monosodium glutamate). MSG is sometimes added to [place] food, bouillon, and some canned foods. What foods are recommended? The items listed may not be a complete list. Talk with your dietitian about what dietary choices are best for you. Grains Low-sodium cereals, including oats, puffed wheat and rice, and shredded wheat. Low-sodium crackers. Unsalted rice. Unsalted pasta. Low-sodium bread. Whole-grain breads and whole-grain pasta. Vegetables Fresh or frozen vegetables. "No salt added" canned vegetables. "No salt added" tomato sauce and paste. Low-sodium or reduced-sodium tomato and vegetable juice.  Fruits Fresh, frozen, or canned fruit. Fruit juice. Meats and other protein foods Fresh or frozen (no salt added) meat, poultry, seafood, and fish. Low-sodium canned tuna and salmon. Unsalted nuts. Dried peas, beans, and lentils without added salt. Unsalted canned beans. Eggs. Unsalted nut butters. Dairy Milk. Soy milk. Cheese that is naturally low in sodium, such as ricotta cheese, fresh mozzarella, or Swiss cheese Low-sodium or reduced-sodium cheese. Cream cheese. Yogurt. Fats and oils Unsalted butter. Unsalted margarine with no trans fat. Vegetable oils such as canola or olive oils. Seasonings and other foods Fresh and dried herbs and spices. Salt-free seasonings. Low-sodium mustard and ketchup. Sodium-free salad dressing. Sodium-free light mayonnaise. Fresh or refrigerated horseradish. Lemon juice. Vinegar. Homemade, reduced-sodium, or low-sodium soups. Unsalted popcorn and pretzels. Low-salt or salt-free chips. What foods are not recommended? The items listed may not be a complete list. Talk with your dietitian about what dietary choices are best for you. Grains Instant hot cereals. Bread stuffing, pancake, and  biscuit mixes. Croutons. Seasoned rice or pasta mixes. Noodle soup cups. Boxed or frozen macaroni and cheese. Regular salted crackers. Self-rising flour. Vegetables Sauerkraut, pickled vegetables, and relishes. Olives. [place] fries. Onion rings. Regular canned vegetables (not low-sodium or reduced-sodium). Regular canned tomato sauce and paste (not low-sodium or reduced-sodium). Regular tomato and vegetable juice (not low-sodium or reduced-sodium). Frozen vegetables in sauces. Meats and other protein foods Meat or fish that is salted, canned, smoked, spiced, or pickled. Bacon, ham, sausage, hotdogs, corned beef, chipped beef, packaged lunch meats, salt pork, jerky, pickled herring, anchovies, regular canned tuna, sardines, salted nuts. Dairy Processed cheese and cheese spreads. Cheese curds. Blue cheese. Feta cheese. String cheese. Regular cottage cheese. Buttermilk. Canned milk. Fats and oils Salted butter. Regular margarine. Ghee. Bacon fat. Seasonings and other foods Onion salt, garlic salt, seasoned salt, table salt, and sea salt. Canned and packaged gravies. Worcestershire sauce. Tartar sauce. Barbecue sauce. Teriyaki sauce. Soy sauce, including reduced-sodium. Steak sauce. Fish sauce. Oyster sauce. Cocktail sauce. Horseradish that you find on the shelf. Regular ketchup and mustard. Meat flavorings and tenderizers. Bouillon cubes. Hot sauce and Tabasco sauce. Premade or packaged marinades. Premade or packaged taco seasonings. Relishes. Regular salad dressings. Salsa. Potato and tortilla chips. Corn chips and puffs. Salted popcorn and pretzels. Canned or dried soups. Pizza. Frozen entrees and pot pies. Summary  Eating less sodium can help lower your blood pressure, reduce swelling, and protect your heart, liver, and kidneys.  Most people on this plan should limit their sodium intake to 1,500-2,000 mg (milligrams) of sodium each day.  Canned, boxed, and frozen foods are high in sodium. Restaurant  foods, fast foods, and pizza are also very high in sodium. You also get sodium by adding salt to food.  Try to cook at home, eat more fresh fruits and vegetables, and eat less fast food, canned, processed, or prepared foods. This information is not intended to replace advice given to you by your health care provider. Make sure you discuss any questions you have with your health care provider. Document Released: 07/10/2001 Document Revised: 01/12/2016 Document Reviewed: 01/12/2016 Elsevier Interactive Patient Education  2017 Elsevier Inc.  Please help [place] help you:  We are honored you have chosen [facility] for your Primary Care home. Below you will find basic instructions that you may need to access in the future. Please help [place] help you by reading the instructions, which cover many of the frequent questions we experience.   Prescription refills and request:  -In order  to allow more efficient response time, please call your pharmacy for all refills. They will forward the request electronically to [place]. This allows for the quickest possible response. Request left on a nurse line can take longer to refill, since these are checked as time allows between office patients and other phone calls.  - refill request can take up to 3-5 working days to complete.  - If request is sent electronically and request is appropiate, it is usually completed in 1-2 business days.  - all patients will need to be seen routinely for all chronic medical conditions requiring prescription medications (see follow-up below). If you are overdue for follow up on your condition, you will be asked to make an appointment and we will call in enough medication to cover you until your appointment (up to 30 days).  - all controlled substances will require a face to face visit to request/refill.  - if you desire your prescriptions to go through a new pharmacy, and have an active script at original pharmacy, you will need to call your  pharmacy and have scripts transferred to new pharmacy. This is completed between the pharmacy locations and not by your provider.    Results: If any images or labs were ordered, it can take up to 1 week to get results depending on the test ordered and the lab/facility running and resulting the test. - Normal or stable results, which do not need further discussion, may be released to your mychart immediately with attached note to you. A call may not be generated for normal results. Please make certain to sign up for mychart. If you have questions on how to activate your mychart you can call the front office.  - If your results need further discussion, our office will attempt to contact you via phone, and if unable to reach you after 2 attempts, we will release your abnormal result to your mychart with instructions.  - All results will be automatically released in mychart after 1 week.  - Your provider will provide you with explanation and instruction on all relevant material in your results. Please keep in mind, results and labs may appear confusing or abnormal to the untrained eye, but it does not mean they are actually abnormal for you personally. If you have any questions about your results that are not covered, or you desire more detailed explanation than what was provided, you should make an appointment with your provider to do so.   Our office handles many outgoing and incoming calls daily. If we have not contacted you within 1 week about your results, please check your mychart to see if there is a message first and if not, then contact our office.  In helping with this matter, you help decrease call volume, and therefore allow [place] to be able to respond to patients needs more efficiently.   Acute office visits (sick visit):  An acute visit is intended for a new problem and are scheduled in shorter time slots to allow schedule openings for patients with new problems. This is the appropriate visit to  discuss a new problem. In order to provide you with excellent quality medical care with proper time for you to explain your problem, have an exam and receive treatment with instructions, these appointments should be limited to one new problem per visit. If you experience a new problem, in which you desire to be addressed, please make an acute office visit, we save openings on the schedule to accommodate you. Please  do not save your new problem for any other type of visit, let us take care of it properly and quickly for you.   Follow up visits:  Depending on your condition(s) your provider will need to see you routinely in order to provide you with quality care and prescribe medication(s). Most chronic conditions (Example: hypertension, Diabetes, depression/anxiety... etc), require visits a couple times a year. Your provider will instruct you on proper follow up for your personal medical conditions and history. Please make certain to make follow up appointments for your condition as instructed. Failing to do so could result in lapse in your medication treatment/refills. If you request a refill, and are overdue to be seen on a condition, we will always provide you with a 30 day script (once) to allow you time to schedule.    Medicare wellness (well visit): - we have a wonderful Nurse Selena Batten), that will meet with you and provide you will yearly medicare wellness visits. These visits should occur yearly (can not be scheduled less than 1 calendar year apart) and cover preventive health, immunizations, advance directives and screenings you are entitled to yearly through your medicare benefits. Do not miss out on your entitled benefits, this is when medicare will pay for these benefits to be ordered for you.  These are strongly encouraged by your provider and is the appropriate type of visit to make certain you are up to date with all preventive health benefits. If you have not had your medicare wellness exam in the  last 12 months, please make certain to schedule one by calling the office and schedule your medicare wellness with Selena Batten as soon as possible.   Yearly physical (well visit):  - Adults are recommended to be seen yearly for physicals. Check with your insurance and date of your last physical, most insurances require one calendar year between physicals. Physicals include all preventive health topics, screenings, medical exam and labs that are appropriate for gender/age and history. You may have fasting labs needed at this visit. This is a well visit (not a sick visit), new problems should not be covered during this visit (see acute visit).  - Pediatric patients are seen more frequently when they are younger. Your provider will advise you on well child visit timing that is appropriate for your their age. - This is not a medicare wellness visit. Medicare wellness exams do not have an exam portion to the visit. Some medicare companies allow for a physical, some do not allow a yearly physical. If your medicare allows a yearly physical you can schedule the medicare wellness with our nurse Selena Batten and have your physical with your provider after, on the same day. Please check with insurance for your full benefits.   Late Policy/No Shows:  - all new patients should arrive 15-30 minutes earlier than appointment to allow [place] time  to  obtain all personal demographics,  insurance information and for you to complete office paperwork. - All established patients should arrive 10-15 minutes earlier than appointment time to update all information and be checked in .  - In our best efforts to run on time, if you are late for your appointment you will be asked to either reschedule or if able, we will work you back into the schedule. There will be a wait time to work you back in the schedule,  depending on availability.  - If you are unable to make it to your appointment as scheduled, please call 24 hours ahead of time  to allow us to  fill the time slot with someone else who needs to be seen. If you do not cancel your appointment ahead of time, you may be charged a no show fee.

## 2016-12-03 ENCOUNTER — Ambulatory Visit (INDEPENDENT_AMBULATORY_CARE_PROVIDER_SITE_OTHER): Payer: 59

## 2016-12-03 ENCOUNTER — Telehealth: Payer: Self-pay | Admitting: Family Medicine

## 2016-12-03 DIAGNOSIS — Z23 Encounter for immunization: Secondary | ICD-10-CM

## 2016-12-03 DIAGNOSIS — I10 Essential (primary) hypertension: Secondary | ICD-10-CM

## 2016-12-03 MED ORDER — LISINOPRIL 10 MG PO TABS: 10.0000 mg | ORAL_TABLET | Freq: Every day | ORAL | 0 refills | Status: DC

## 2016-12-03 NOTE — Telephone Encounter (Signed)
Please call pt: - his labs are normal.  - I have called the blood pressure medication we discussed, Lisinopril 10 mg a day.  - I would also recommend her start a daily baby ASA if able to tolerate. - Follow up in 2 weeks (he is scheduled already).

## 2016-12-03 NOTE — Progress Notes (Addendum)
Patient presents today for #1 Shingrix injection. Given with no problems or incidence.  Patient left with no complaints.  Medical screening examination/treatment/procedure(s) were performed by non-physician practitioner and as supervising physician I was immediately available for consultation/collaboration.  I agree with above assessment and plan.  Electronically Signed by: Felix Pacinienee Kuneff, DO [facility] [place]

## 2016-12-03 NOTE — Telephone Encounter (Signed)
Spoke with patient reviewed lab results and instructions. Patient verbalized understanding. 

## 2016-12-13 ENCOUNTER — Encounter: Payer: Self-pay | Admitting: Family Medicine

## 2016-12-13 ENCOUNTER — Ambulatory Visit: Payer: 59 | Admitting: Family Medicine

## 2016-12-13 VITALS — BP 129/87 | HR 70 | Temp 98.5°F | Resp 20 | Ht 70.0 in | Wt 185.0 lb

## 2016-12-13 DIAGNOSIS — E663 Overweight: Secondary | ICD-10-CM

## 2016-12-13 DIAGNOSIS — I10 Essential (primary) hypertension: Secondary | ICD-10-CM

## 2016-12-13 MED ORDER — LISINOPRIL 10 MG PO TABS: 10.0000 mg | ORAL_TABLET | Freq: Every day | ORAL | 1 refills | Status: DC

## 2016-12-13 NOTE — Patient Instructions (Addendum)
Your Blood pressure looks good today. Continue lisinopril 10 mg a day, I have refilled this medication for you in 90 day quantity, with 1 refill.  Follow up in 6 months.   Continue low sodium.    Hypertension Hypertension is another name for high blood pressure. High blood pressure forces your heart to work harder to pump blood. This can cause problems over time. There are two numbers in a blood pressure reading. There is a top number (systolic) over a bottom number (diastolic). It is best to have a blood pressure below 120/80. Healthy choices can help lower your blood pressure. You may need medicine to help lower your blood pressure if:  Your blood pressure cannot be lowered with healthy choices.  Your blood pressure is higher than 130/80.  Follow these instructions at home: Eating and drinking  If directed, follow the DASH eating plan. This diet includes: ? Filling half of your plate at each meal with fruits and vegetables. ? Filling one quarter of your plate at each meal with whole grains. Whole grains include whole wheat pasta, brown rice, and whole grain bread. ? Eating or drinking low-fat dairy products, such as skim milk or low-fat yogurt. ? Filling one quarter of your plate at each meal with low-fat (lean) proteins. Low-fat proteins include fish, skinless chicken, eggs, beans, and tofu. ? Avoiding fatty meat, cured and processed meat, or chicken with skin. ? Avoiding premade or processed food.  Eat less than 1,500 mg of salt (sodium) a day.  Limit alcohol use to no more than 1 drink a day for nonpregnant women and 2 drinks a day for men. One drink equals 12 oz of beer, 5 oz of wine, or 1 oz of hard liquor. Lifestyle  Work with your doctor to stay at a healthy weight or to lose weight. Ask your doctor what the best weight is for you.  Get at least 30 minutes of exercise that causes your heart to beat faster (aerobic exercise) most days of the week. This may include walking,  swimming, or biking.  Get at least 30 minutes of exercise that strengthens your muscles (resistance exercise) at least 3 days a week. This may include lifting weights or pilates.  Do not use any products that contain nicotine or tobacco. This includes cigarettes and e-cigarettes. If you need help quitting, ask your doctor.  Check your blood pressure at home as told by your doctor.  Keep all follow-up visits as told by your doctor. This is important. Medicines  Take over-the-counter and prescription medicines only as told by your doctor. Follow directions carefully.  Do not skip doses of blood pressure medicine. The medicine does not work as well if you skip doses. Skipping doses also puts you at risk for problems.  Ask your doctor about side effects or reactions to medicines that you should watch for. Contact a doctor if:  You think you are having a reaction to the medicine you are taking.  You have headaches that keep coming back (recurring).  You feel dizzy.  You have swelling in your ankles.  You have trouble with your vision. Get help right away if:  You get a very bad headache.  You start to feel confused.  You feel weak or numb.  You feel faint.  You get very bad pain in your: ? Chest. ? Belly (abdomen).  You throw up (vomit) more than once.  You have trouble breathing. Summary  Hypertension is another name for high blood pressure.  Making healthy choices can help lower blood pressure. If your blood pressure cannot be controlled with healthy choices, you may need to take medicine. This information is not intended to replace advice given to you by your health care provider. Make sure you discuss any questions you have with your health care provider. Document Released: 07/07/2007 Document Revised: 12/17/2015 Document Reviewed: 12/17/2015 Elsevier Interactive Patient Education  2018 Elsevier Inc.  

## 2016-12-13 NOTE — Progress Notes (Signed)
Patient ID: Alejandro English, male  DOB: [birth year], 51 y.o.   MRN: [id number] Patient Care Team    Relationship Specialty Notifications Start End  Ma Hillock, DO PCP - General Family Medicine  12/02/16     Chief Complaint  Patient presents with  . Hypertension    Subjective:  Alejandro English is a 51 y.o.  male present for  Hypertension: Pt reports compliance with lisinopril 10 mg, which was started 2 weeks ago. Blood pressures ranges at home not checked. Patient denies chest pain, shortness of breath or lower extremity edema. Pt is taking a daily baby ASA. Pt is not prescribed statin. BMP: 12/02/2016 GFR 81, creatinine 1.02 CBC: 12/02/2016 very mildly elevated hemoglobin 17.2 Diet/exercise: Low-sodium, exercises routinely. RF: Hypertension, overweight  Depression screen [facility] 2/9 12/02/2016  Decreased Interest 0  Down, Depressed, Hopeless 0  PHQ - 2 Score 0   No flowsheet data found.   Fall Risk  12/02/2016  Falls in the past year? No   Immunization History  Administered Date(s) Administered  . Influenza,inj,Quad PF,6+ Mos 12/02/2016  . Zoster Recombinat (Shingrix) 12/03/2016    No exam data present  Past Medical History:  Diagnosis Date  . Allergy   . Anal fissure 2005  . Chickenpox   . H/O syncope   . History of UTI   . Hypertension 12/2016  . Internal hemorrhoids    follow by  Dr. Hilarie Fredrickson   Allergies  Allergen Reactions  . Penicillins Rash   Past Surgical History:  Procedure Laterality Date  . ANAL FISTULOTOMY  2008  . COLONOSCOPY  07/02/2016   Normal, 10 year follow-up. Dr. Hilarie Fredrickson   Family History  Problem Relation Age of Onset  . Prostate cancer Father   . Diverticulitis Father        part of colon removed  . Hypertension Brother   . Other Maternal Grandmother        died at 74  . Brain cancer Maternal Grandfather   . Diverticulitis Paternal Grandmother   . Colon cancer Neg Hx   . Stomach cancer Neg Hx   . Rectal cancer Neg Hx   . Esophageal  cancer Neg Hx   . Liver cancer Neg Hx   . Colon polyps Neg Hx    Social History   Socioeconomic History  . Marital status: Single    Spouse name: Not on file  . Number of children: 2  . Years of education: 25  . Highest education level: Not on file  Social Needs  . Financial resource strain: Not on file  . Food insecurity - worry: Not on file  . Food insecurity - inability: Not on file  . Transportation needs - medical: Not on file  . Transportation needs - non-medical: Not on file  Occupational History  . Occupation: Artist  Tobacco Use  . Smoking status: Never Smoker  . Smokeless tobacco: Never Used  Substance and Sexual Activity  . Alcohol use: Yes    Comment: has one drink per week  . Drug use: No  . Sexual activity: No    Partners: Female  Other Topics Concern  . Not on file  Social History Narrative   Single. Has 2 children. Works as a Pharmacist, hospital.   MBA degree.   Wears a bicycle helmet. Wears his seatbelt.   Exercises routinely.   Allergies as of 12/13/2016      Reactions   Penicillins Rash      Medication  List        Accurate as of 12/13/16  5:05 PM. Always use your most recent med list.          aspirin EC 81 MG tablet Take 81 mg daily by mouth.   lisinopril 10 MG tablet Commonly known as:  PRINIVIL,ZESTRIL Take 1 tablet (10 mg total) daily by mouth.       All past medical history, surgical history, allergies, family history, immunizations andmedications were updated in the EMR today and reviewed under the history and medication portions of their EMR.    Recent Results (from the past 2160 hour(s))  CBC     Status: Abnormal   Collection Time: 12/02/16 10:29 AM  Result Value Ref Range   WBC 6.6 4.0 - 10.5 K/uL   RBC 5.79 4.22 - 5.81 Mil/uL   Platelets 166.0 150.0 - 400.0 K/uL   Hemoglobin 17.2 (H) 13.0 - 17.0 g/dL   HCT 51.0 39.0 - 52.0 %   MCV 88.2 78.0 - 100.0 fl   MCHC 33.7 30.0 - 36.0 g/dL   RDW 13.0 11.5 - 15.5 %    Comp Met (CMET)     Status: Abnormal   Collection Time: 12/02/16 10:29 AM  Result Value Ref Range   Sodium 142 135 - 145 mEq/L   Potassium 4.6 3.5 - 5.1 mEq/L   Chloride 104 96 - 112 mEq/L   CO2 33 (H) 19 - 32 mEq/L   Glucose, Bld 76 70 - 99 mg/dL   BUN 16 6 - 23 mg/dL   Creatinine, Ser 1.02 0.40 - 1.50 mg/dL   Total Bilirubin 0.7 0.2 - 1.2 mg/dL   Alkaline Phosphatase 69 39 - 117 U/L   AST 17 0 - 37 U/L   ALT 17 0 - 53 U/L   Total Protein 7.5 6.0 - 8.3 g/dL   Albumin 4.6 3.5 - 5.2 g/dL   Calcium 9.8 8.4 - 10.5 mg/dL   GFR 81.79 >60.00 mL/min  TSH     Status: None   Collection Time: 12/02/16 10:29 AM  Result Value Ref Range   TSH 1.91 0.35 - 4.50 uIU/mL  HgB A1c     Status: None   Collection Time: 12/02/16 10:29 AM  Result Value Ref Range   Hgb A1c MFr Bld 5.2 4.6 - 6.5 %    Comment: Glycemic Control Guidelines for People with Diabetes:Non Diabetic:  <6%Goal of Therapy: <7%Additional Action Suggested:  >8%   PSA     Status: None   Collection Time: 12/02/16 10:29 AM  Result Value Ref Range   PSA 2.01 0.10 - 4.00 ng/mL    Comment: Test performed using Access [employer]br[employer]ch PSA Assay, a parmagnetic partical, chemiluminecent immunoassay.    Patient was never admitted.   ROS: 14 pt review of systems performed and negative (unless mentioned in an HPI)  Objective: BP 129/87 (BP Location: Left Arm, Patient Position: Sitting, Cuff Size: Large)   Pulse 70   Temp 98.5 F (36.9 C)   Resp 20   Ht _0  (1.778 m)   Wt 185 lb (83.9 kg)   SpO2 98%   BMI 26.54 kg/m  Gen: Afebrile. No acute distress.  HENT: AT. [place].  MMM.  Eyes:Pupils Equal Round Reactive to light, Extraocular movements intact,  Conjunctiva without redness, discharge or icterus. CV: RRR no murmur, no edema, +2/4 P posterior tibialis pulses Chest: CTAB, no wheeze or crackles Abd: Soft. NTND. BS present.  Neuro:  Normal gait. PERLA. EOMi. Alert. Oriented  x3  Assessment/plan: Alejandro English is a 51 y.o. male present  for establish care with elevated BP.  Essential hypertension/overweight: - Stable on lisinopril 10 mg daily. Refills provided today for 6 months.  - Low-sodium diet encouraged. Exercise greater than 150 minutes a week encouraged. - Continue daily baby aspirin - CBC, CMP, A1c and TSH are within normal limits. - Patient will need lipids collected. - Follow-up 6 months   Return in about 6 months (around 06/12/2017) for HTN'.    Note is dictated utilizing voice recognition software. Although note has been proof read prior to signing, occasional typographical errors still can be missed. If any questions arise, please do not hesitate to call for verification.  Electronically signed by: Howard Pouch, DO [place]

## 2017-01-01 DIAGNOSIS — N200 Calculus of kidney: Secondary | ICD-10-CM | POA: Insufficient documentation

## 2017-01-01 HISTORY — DX: Calculus of kidney: N20.0

## 2017-01-18 ENCOUNTER — Ambulatory Visit: Payer: 59 | Admitting: Internal Medicine

## 2017-01-20 DIAGNOSIS — Z9289 Personal history of other medical treatment: Secondary | ICD-10-CM

## 2017-01-20 HISTORY — DX: Personal history of other medical treatment: Z92.89

## 2017-01-20 LAB — HEMOGLOBIN A1C: Hemoglobin A1C: 5.2

## 2017-01-20 LAB — CBC AND DIFFERENTIAL
HCT: 48 (ref 41–53)
HEMOGLOBIN: 16.7 (ref 13.5–17.5)
Neutrophils Absolute: 3
PLATELETS: 158 (ref 150–399)
WBC: 5.3

## 2017-01-20 LAB — LIPID PANEL
Cholesterol: 147 (ref 0–200)
HDL: 46 (ref 35–70)
LDL Cholesterol: 92
Triglycerides: 48 (ref 40–160)

## 2017-01-20 LAB — VITAMIN D 25 HYDROXY (VIT D DEFICIENCY, FRACTURES): Vit D, 25-Hydroxy: 23.2

## 2017-01-20 LAB — HEPATIC FUNCTION PANEL
ALT: 18 (ref 10–40)
AST: 21 (ref 14–40)
Alkaline Phosphatase: 82 (ref 25–125)
Bilirubin, Total: 0.5

## 2017-01-20 LAB — PSA: PSA: 1.8

## 2017-01-20 LAB — BASIC METABOLIC PANEL
BUN: 18 (ref 4–21)
CREATININE: 1.1 (ref 0.6–1.3)
GLUCOSE: 113
POTASSIUM: 4.5 (ref 3.4–5.3)
SODIUM: 141 (ref 137–147)

## 2017-01-20 LAB — POCT ERYTHROCYTE SEDIMENTATION RATE, NON-AUTOMATED: Sed Rate: 3

## 2017-01-20 LAB — HM HIV SCREENING LAB: HM HIV Screening: NEGATIVE

## 2017-01-20 LAB — TSH: TSH: 1.1 (ref 0.41–5.90)

## 2017-01-20 LAB — VITAMIN B12: Vitamin B-12: 703

## 2017-01-20 LAB — HM HEPATITIS C SCREENING LAB: HM HEPATITIS C SCREENING: NEGATIVE

## 2017-02-01 DIAGNOSIS — M810 Age-related osteoporosis without current pathological fracture: Secondary | ICD-10-CM

## 2017-02-01 HISTORY — DX: Age-related osteoporosis without current pathological fracture: M81.0

## 2017-02-07 ENCOUNTER — Ambulatory Visit (INDEPENDENT_AMBULATORY_CARE_PROVIDER_SITE_OTHER): Payer: 59

## 2017-02-07 DIAGNOSIS — Z23 Encounter for immunization: Secondary | ICD-10-CM | POA: Diagnosis not present

## 2017-02-07 NOTE — Progress Notes (Addendum)
Patient presents today for Shingrix #2 Vaccine. Given with no incidence or problems. Patient left with no complaints. Medical screening examination/treatment/procedure(s) were performed by non-physician practitioner and as supervising physician I was immediately available for consultation/collaboration.  I agree with above assessment and plan.  Electronically Signed by: Felix Pacinienee Kuneff, DO [facility]- [place]

## 2017-02-18 ENCOUNTER — Ambulatory Visit: Payer: Self-pay

## 2017-02-18 NOTE — Telephone Encounter (Signed)
Pt. Called to see what he could take for a cold with high blood pressure. Instructed to try Coricidin HBP. Verbalizes understanding.

## 2017-02-21 ENCOUNTER — Encounter: Payer: Self-pay | Admitting: *Deleted

## 2017-02-24 ENCOUNTER — Encounter: Payer: Self-pay | Admitting: Family Medicine

## 2017-04-15 ENCOUNTER — Telehealth: Payer: Self-pay

## 2017-04-15 ENCOUNTER — Ambulatory Visit: Payer: 59 | Admitting: Family Medicine

## 2017-04-15 ENCOUNTER — Encounter: Payer: Self-pay | Admitting: Family Medicine

## 2017-04-15 VITALS — BP 120/77 | HR 101 | Temp 98.4°F | Ht 70.0 in | Wt 184.0 lb

## 2017-04-15 DIAGNOSIS — R6889 Other general symptoms and signs: Secondary | ICD-10-CM

## 2017-04-15 DIAGNOSIS — B349 Viral infection, unspecified: Secondary | ICD-10-CM

## 2017-04-15 LAB — POC INFLUENZA A&B (BINAX/QUICKVUE)
INFLUENZA A, POC: NEGATIVE
INFLUENZA B, POC: NEGATIVE

## 2017-04-15 MED ORDER — OSELTAMIVIR PHOSPHATE 75 MG PO CAPS: 75.0000 mg | ORAL_CAPSULE | Freq: Two times a day (BID) | ORAL | 0 refills | Status: DC

## 2017-04-15 NOTE — Progress Notes (Signed)
Alejandro English , 05/02/1965, 52 y.o., male MRN: [id number] Patient Care Team    Relationship Specialty Notifications Start End  Natalia Leatherwood, DO PCP - General Family Medicine  12/02/16     Chief Complaint  Patient presents with  . Cough    pt c/o non productive cough that is accomplain with headche, fever, chills, chest congestion and lower back pain X 1 day. try extra strength tynelol for relief     Subjective: Pt presents for an OV with complaints of nonproductive cough, headache, fever, chills, chest congestion  of 1 days duration. Flu shot UTD 12/2016. Exposed to sick contacts through work.  Pt has tried tylenol  to ease their symptoms.   Depression screen PHQ 2/9 12/02/2016  Decreased Interest 0  Down, Depressed, Hopeless 0  PHQ - 2 Score 0    Allergies  Allergen Reactions  . Penicillins Rash   Social History   Tobacco Use  . Smoking status: Never Smoker  . Smokeless tobacco: Never Used  Substance Use Topics  . Alcohol use: Yes    Comment: has one drink per week   Past Medical History:  Diagnosis Date  . Allergy   . Anal fissure 2005  . Chickenpox   . H/O syncope   . History of EKG 01/20/2017   normal  . History of UTI   . Hypertension 12/2016  . Internal hemorrhoids    follow by  Dr. Rhea Belton  . Kidney stone 2018   Incidental finding on left kidney ultrasound 2018.  . Vitamin D deficiency 2018   23   Past Surgical History:  Procedure Laterality Date  . ANAL FISTULOTOMY  2006  . COLONOSCOPY  07/02/2016   Normal, 10 year follow-up. Dr. Rhea Belton   Family History  Problem Relation Age of Onset  . Prostate cancer Father   . Diverticulitis Father        part of colon removed  . Hypertension Father   . Hypertension Brother   . Other Maternal Grandmother        died at 51  . Brain cancer Maternal Grandfather   . Diverticulitis Paternal Grandmother   . Diabetes Mellitus I Daughter   . Colon cancer Neg Hx   . Stomach cancer Neg Hx   . Rectal cancer  Neg Hx   . Esophageal cancer Neg Hx   . Liver cancer Neg Hx   . Colon polyps Neg Hx    Allergies as of 04/15/2017      Reactions   Penicillins Rash      Medication List        Accurate as of 04/15/17 10:02 AM. Always use your most recent med list.          aspirin EC 81 MG tablet Take 81 mg daily by mouth.   lisinopril 10 MG tablet Commonly known as:  PRINIVIL,ZESTRIL Take 1 tablet (10 mg total) daily by mouth.       All past medical history, surgical history, allergies, family history, immunizations andmedications were updated in the EMR today and reviewed under the history and medication portions of their EMR.     ROS: Negative, with the exception of above mentioned in HPI   Objective:  BP 120/77 (BP Location: Left Arm, Patient Position: Sitting, Cuff Size: Normal)   Pulse (!) 101   Temp 98.4 F (36.9 C) (Oral)   Ht 5\' 10"  (1.778 m)   Wt 184 lb (83.5 kg)   SpO2 98%  BMI 26.40 kg/m  Body mass index is 26.4 kg/m. Gen: Afebrile. No acute distress. Nontoxic in appearance, well developed, well nourished.  HENT: AT. [place]. Bilateral TM visualized with fulness bilaterally. MMM, no oral lesions. Bilateral nares with erythema, drainage . Throat without erythema or exudates. Cough and hoarseness present.  Eyes:Pupils Equal Round Reactive to light, Extraocular movements intact,  Conjunctiva without redness, discharge or icterus. Neck/lymp/endocrine: Supple,no lymphadenopathy CV: RRR (tachy), no edema Chest: CTAB, no wheeze or crackles. Good air movement, normal resp effort.  Abd: Soft. NTND. BS present. No  Masses palpated. Skin: no rashes, purpura or petechiae.  Neuro:  Normal gait. PERLA. EOMi. Alert. Oriented x3   No exam data present No results found. No results found for this or any previous visit (from the past 24 hour(s)).  Assessment/Plan: Alejandro English is a 52 y.o. male present for OV for  Flu-like symptoms - POC Influenza A&B (Binax test)--> negative. Viral  syndrome Clinically appears to be the flu. Onset symptoms may be too soon to test. Rest, hydrate. Pushing fluids important w/ lisinopril use. Tolerating.  mucinex (DM if cough), nettie pot or nasal saline.  Tamilfu prescribed, take until completed.  If cough present it can last up to 6-8 weeks.  F/U 2 weeks of not improved.     Reviewed expectations re: course of current medical issues.  Discussed self-management of symptoms.  Outlined signs and symptoms indicating need for more acute intervention.  Patient verbalized understanding and all questions were answered.  Patient received an After-Visit Summary.    Orders Placed This Encounter  Procedures  . POC Influenza A&B (Binax test)     Note is dictated utilizing voice recognition software. Although note has been proof read prior to signing, occasional typographical errors still can be missed. If any questions arise, please do not hesitate to call for verification.   electronically signed by:  Felix Pacinienee Alexcia Schools, DO  [facility] - [place]

## 2017-04-15 NOTE — Patient Instructions (Signed)
Clinically appears to be the flu. Onset symptoms may be too soon to test. Rest, hydrate. Pushing fluids important w/ lisinopril use. Tolerating.  mucinex (DM if cough), nettie pot or nasal saline.  Tamilfu prescribed, take until completed.  If cough present it can last up to 6-8 weeks.  F/U 2 weeks of not improved.  WASH YOUR HANDS SO fiance' does not get.    Influenza, Adult Influenza ("the flu") is an infection in the lungs, nose, and throat (respiratory tract). It is caused by a virus. The flu causes many common cold symptoms, as well as a high fever and body aches. It can make you feel very sick. The flu spreads easily from person to person (is contagious). Getting a flu shot (influenza vaccination) every year is the best way to prevent the flu. Follow these instructions at home:  Take over-the-counter and prescription medicines only as told by your doctor.  Use a cool mist humidifier to add moisture (humidity) to the air in your home. This can make it easier to breathe.  Rest as needed.  Drink enough fluid to keep your pee (urine) clear or pale yellow.  Cover your mouth and nose when you cough or sneeze.  Wash your hands with soap and water often, especially after you cough or sneeze. If you cannot use soap and water, use hand sanitizer.  Stay home from work or school as told by your doctor. Unless you are visiting your doctor, try to avoid leaving home until your fever has been gone for 24 hours without the use of medicine.  Keep all follow-up visits as told by your doctor. This is important. How is this prevented?  Getting a yearly (annual) flu shot is the best way to avoid getting the flu. You may get the flu shot in late summer, fall, or winter. Ask your doctor when you should get your flu shot.  Wash your hands often or use hand sanitizer often.  Avoid contact with people who are sick during cold and flu season.  Eat healthy foods.  Drink plenty of fluids.  Get  enough sleep.  Exercise regularly. Contact a doctor if:  You get new symptoms.  You have: ? Chest pain. ? Watery poop (diarrhea). ? A fever.  Your cough gets worse.  You start to have more mucus.  You feel sick to your stomach (nauseous).  You throw up (vomit). Get help right away if:  You start to be short of breath or have trouble breathing.  Your skin or nails turn a bluish color.  You have very bad pain or stiffness in your neck.  You get a sudden headache.  You get sudden pain in your face or ear.  You cannot stop throwing up. This information is not intended to replace advice given to you by your health care provider. Make sure you discuss any questions you have with your health care provider. Document Released: 10/28/2007 Document Revised: 06/26/2015 Document Reviewed: 11/12/2014 Elsevier Interactive Patient Education  2017 [employer]

## 2017-04-15 NOTE — Telephone Encounter (Signed)
Appointment scheduled.

## 2017-04-15 NOTE — Telephone Encounter (Signed)
[facility] Client TELEPHONE ADVICE RECORD [facility] Patient Name: Alejandro English Gender: Male DOB: 11/06/1965 Age: [age] Y 6 M 10 D Return Phone Number: [phone] (Primary) Address: [place]: [place] [id number] Client [facility] - Client Client Site [facility] Claiborne Bill[place]une[place], [place] Contact Type Call Who Is Calling Patient / Member / Family / Caregiver Call Type Triage / Clinical Relationship To Patient Self Return Phone Number [phone] (Primary) Chief Complaint Headache Reason for Call Symptomatic / Request for Health Information Initial Comment Caller is violently coughing. feels like he has someone sitting on his chest, slight fever and headache. Translation No Nurse Assessment Nurse: Cherly HensenBeeler, RN, Melissa Date/Time (Eastern Time): 04/15/2017 5:49:05 AM Confirm and document reason for call. If symptomatic, describe symptoms. ---Caller states that he started yesterday with a cough, feels like someone is sitting on his chest, uncomfortable, and temp is 102.1 temporal. Does the patient have any new or worsening symptoms? ---Yes Will a triage be completed? ---Yes Related visit to physician within the last 2 weeks? ---No Does the PT have any chronic conditions? (i.e. diabetes, asthma, etc.) ---Yes List chronic conditions. ---HTN Is this a behavioral health or substance abuse call? ---No Guidelines Guideline Title Affirmed Question Affirmed Notes Nurse Date/Time (Eastern Time) Influenza - Seasonal Patient sounds very sick or weak to the triager Cherly HensenBeeler, RN, Efraim KaufmannMelissa 04/15/2017 5:54:22 AM Disp. Time [name](Eastern Time) Disposition Final User 04/15/2017 5:57:08 AM Go to ED Now (or PCP triage) Yes Cherly HensenBeeler, RN, Melissa Caller Disagree/Comply Comply Caller Understands Yes PLEASE NOTE: All timestamps contained within this report are represented as [place] Standard  Time. CONFIDENTIALTY NOTICE: This fax transmission is intended only for the addressee. It contains information that is legally privileged, confidential or otherwise protected from use or disclosure. If you are not the intended recipient, you are strictly prohibited from reviewing, disclosing, copying using or disseminating any of this information or taking any action in reliance on or regarding this information. If you have received this fax in error, please notify us immediately by telephone so that we can arrange for its return to us. Phone: [phone], Toll-Free: [phone], Fax: [phone] Page: 2 of 2 Call Id: [id number] PreDisposition Call Doctor Care Advice Given Per Guideline GO TO ED NOW (OR PCP TRIAGE): * IF NO PCP TRIAGE: You need to be seen. [facility] within the next hour. Leave as soon as you can. DRIVING: Another adult should drive. CARE ADVICE given per INFLUENZA - SEASONAL (Adult) guideline. Comments User: Duwaine MaxinMelissa, Beeler, RN Date/Time [name](Eastern Time): 04/15/2017 5:58:11 AM Caller will be contacting PCP for an appt today Referrals REFERRED TO PCP OFFICE

## 2017-06-06 ENCOUNTER — Encounter: Payer: Self-pay | Admitting: Family Medicine

## 2017-06-06 ENCOUNTER — Ambulatory Visit: Payer: 59 | Admitting: Family Medicine

## 2017-06-06 VITALS — BP 101/63 | HR 58 | Temp 98.3°F | Resp 20 | Ht 70.0 in | Wt 181.0 lb

## 2017-06-06 DIAGNOSIS — Z3009 Encounter for other general counseling and advice on contraception: Secondary | ICD-10-CM | POA: Diagnosis not present

## 2017-06-06 DIAGNOSIS — I10 Essential (primary) hypertension: Secondary | ICD-10-CM

## 2017-06-06 DIAGNOSIS — Z0184 Encounter for antibody response examination: Secondary | ICD-10-CM | POA: Diagnosis not present

## 2017-06-06 DIAGNOSIS — E663 Overweight: Secondary | ICD-10-CM | POA: Diagnosis not present

## 2017-06-06 MED ORDER — LISINOPRIL 10 MG PO TABS: 10.0000 mg | ORAL_TABLET | Freq: Every day | ORAL | 1 refills | Status: DC

## 2017-06-06 NOTE — Progress Notes (Signed)
Patient ID: Alejandro English, male  DOB: 1965-12-18, 52 y.o.   MRN: [id number] Patient Care Team    Relationship Specialty Notifications Start End  Ma Hillock, DO PCP - General Family Medicine  12/02/16     Chief Complaint  Patient presents with  . Hypertension    Subjective:  Alejandro English is a 52 y.o.  male present for   Hypertension: Pt reports compliance with lisinopril 10 mg.  Blood pressures ranges at home not checked. Patient denies chest pain, shortness of breath, dizziness or lower extremity edema. Pt is taking a daily baby ASA and has questions on need.  Pt is not prescribed statin.  He reports he has been exercising more and has noticed dizziness when standing up after laying down.  BMP: 01/20/2017 WNL CBC: 01/20/2017 WNL  Lipid: 01/20/2017: WNL TSH: 01/20/2017 WNL Diet/exercise: Low-sodium, exercises routinely. RF: Hypertension, overweight  MMR immunity: pt is planning on traveling outside of the country in June. He is uncertain his immune status. He states his grad school had required he had some vaccines and he had to get vaccines, but he does not know which they were and he does not have records.   Depression screen [facility] 2/9 06/06/2017 12/02/2016  Decreased Interest 0 0  Down, Depressed, Hopeless 0 0  PHQ - 2 Score 0 0   No flowsheet data found.   Fall Risk  12/02/2016  Falls in the past year? No   Immunization History  Administered Date(s) Administered  . Influenza,inj,Quad PF,6+ Mos 12/02/2016  . Zoster Recombinat (Shingrix) 12/03/2016, 02/07/2017    No exam data present  Past Medical History:  Diagnosis Date  . Allergy   . Anal fissure 2005  . Chickenpox   . H/O syncope   . History of EKG 01/20/2017   normal  . History of UTI   . Hypertension 12/2016  . Internal hemorrhoids    follow by  Dr. Hilarie Fredrickson  . Kidney stone 2018   Incidental finding on left kidney ultrasound 2018.  . Vitamin D deficiency 2018   23   Allergies  Allergen Reactions    . Penicillins Rash   Past Surgical History:  Procedure Laterality Date  . ANAL FISTULOTOMY  2006  . COLONOSCOPY  07/02/2016   Normal, 10 year follow-up. Dr. Hilarie Fredrickson   Family History  Problem Relation Age of Onset  . Prostate cancer Father   . Diverticulitis Father        part of colon removed  . Hypertension Father   . Hypertension Brother   . Other Maternal Grandmother        died at 90  . Brain cancer Maternal Grandfather   . Diverticulitis Paternal Grandmother   . Diabetes Mellitus I Daughter   . Colon cancer Neg Hx   . Stomach cancer Neg Hx   . Rectal cancer Neg Hx   . Esophageal cancer Neg Hx   . Liver cancer Neg Hx   . Colon polyps Neg Hx    Social History   Socioeconomic History  . Marital status: Divorced    Spouse name: Not on file  . Number of children: 2  . Years of education: 72  . Highest education level: Not on file  Occupational History  . 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 Financial resource strain: Not on file  . Food insecurity:    Worry: Not on file    Inability: Not on file  . Transportation needs:  Medical: Not on file    Non-medical: Not on file  Tobacco Use  . Smoking status: Never Smoker  . Smokeless tobacco: Never Used  Substance and Sexual Activity  . Alcohol use: Yes    Comment: has one drink per week  . Drug use: No  . Sexual activity: Never    Partners: Female  Lifestyle  . Physical activity:    Days per week: Not on file    Minutes per session: Not on file  . Stress: Not on file  Relationships  . Social connections:    Talks on phone: Not on file    Gets together: Not on file    Attends religious service: Not on file    Active member of club or organization: Not on file    Attends meetings of clubs or organizations: Not on file    Relationship status: Not on file  . Intimate partner violence:    Fear of current or ex partner: Not on file    Emotionally abused: Not on file    Physically abused: Not on  file    Forced sexual activity: Not on file  Other Topics Concern  . Not on file  Social History Narrative   Single. Has 2 children. Works as a Pharmacist, hospital.   MBA degree.   Wears a bicycle helmet. Wears his seatbelt.   Exercises routinely.   Allergies as of 06/06/2017      Reactions   Penicillins Rash      Medication List        Accurate as of 06/06/17  4:54 PM. Always use your most recent med list.          aspirin EC 81 MG tablet Take 81 mg daily by mouth.   lisinopril 10 MG tablet Commonly known as:  PRINIVIL,ZESTRIL Take 1 tablet (10 mg total) by mouth daily.       All past medical history, surgical history, allergies, family history, immunizations andmedications were updated in the EMR today and reviewed under the history and medication portions of their EMR.    Recent Results (from the past 2160 hour(s))  POC Influenza A&B (Binax test)     Status: Normal   Collection Time: 04/15/17 10:06 AM  Result Value Ref Range   Influenza A, POC Negative Negative   Influenza B, POC Negative Negative    Patient was never admitted.   ROS: 14 pt review of systems performed and negative (unless mentioned in an HPI)  Objective: BP 101/63 (BP Location: Left Arm, Patient Position: Sitting, Cuff Size: Normal)   Pulse (!) 58   Temp 98.3 F (36.8 C)   Resp 20   Ht '5\' 10"'  (1.778 m)   Wt 181 lb (82.1 kg)   SpO2 98%   BMI 25.97 kg/m  Gen: Afebrile. No acute distress. Nontoxic in appearance. Well developed, well nourished.  HENT: AT. [place]. MMM.  Eyes:Pupils Equal Round Reactive to light, Extraocular movements intact,  Conjunctiva without redness, discharge or icterus. CV: RRR no murmur, no edema, +2/4 P posterior tibialis pulses. No carotid bruits. Chest: CTAB, no wheeze or crackles Abd: Soft. NTND. BS present. no Masses palpated.  Skin: no rashes, purpura or petechiae.  Neuro:  Normal gait. PERLA. EOMi. Alert. Oriented x3   Assessment/plan: Alejandro English is a 52 y.o.  male present for establish care with elevated BP.  Essential hypertension/overweight: - stable, but low stable. He has mild dehydration sx. Advised to me to increase his water consumption.   -  Monitor BP at home, at least 2 hours after med. - if after hydration and monitoring BP is below 110 consecutively for 1 week or symptoms remain, pt to call in decrease dose to lisinopril 5 mg (1/2 tab) and f/u 1 week later.  - Low-sodium diet encouraged. Exercise greater than 150 minutes a week encouraged. - dicussed benefits vs risk with ASA 81 mg.  - Follow-up 6 months  Immunity status testing - New issue. Discussed immunity titer testing vs MMR, pt would like to have titer - Measles/Mumps/Rubella Immunity--> desired by pt before travel. If low immunity will provide MMR by nurse visit.   Vasectomy evaluation Pt would like to discus vasectomy.  - Ambulatory referral to Urology    Return in about 6 months (around 12/07/2017).    Note is dictated utilizing voice recognition software. Although note has been proof read prior to signing, occasional typographical errors still can be missed. If any questions arise, please do not hesitate to call for verification.  Electronically signed by: Howard Pouch, DO [place]

## 2017-06-06 NOTE — Patient Instructions (Addendum)
Get a blood pressure cuff and monitor, wait until at least 2 hour after taking medication.  Goal is 110-130/60-80 Hydrate!!! Dizziness upon standing can mean dehydration.    If BP lower than above desired, then please call in.   Please help [place] help you:  We are honored you have chosen [facility] for your Primary Care home. Below you will find basic instructions that you may need to access in the future. Please help [place] help you by reading the instructions, which cover many of the frequent questions we experience.   Prescription refills and request:  -In order to allow more efficient response time, please call your pharmacy for all refills. They will forward the request electronically to [place]. This allows for the quickest possible response. Request left on a nurse line can take longer to refill, since these are checked as time allows between office patients and other phone calls.  - refill request can take up to 3-5 working days to complete.  - If request is sent electronically and request is appropiate, it is usually completed in 1-2 business days.  - all patients will need to be seen routinely for all chronic medical conditions requiring prescription medications (see follow-up below). If you are overdue for follow up on your condition, you will be asked to make an appointment and we will call in enough medication to cover you until your appointment (up to 30 days).  - all controlled substances will require a face to face visit to request/refill.  - if you desire your prescriptions to go through a new pharmacy, and have an active script at original pharmacy, you will need to call your pharmacy and have scripts transferred to new pharmacy. This is completed between the pharmacy locations and not by your provider.    Results: If any images or labs were ordered, it can take up to 1 week to get results depending on the test ordered and the lab/facility running and resulting the test. - Normal or  stable results, which do not need further discussion, may be released to your mychart immediately with attached note to you. A call may not be generated for normal results. Please make certain to sign up for mychart. If you have questions on how to activate your mychart you can call the front office.  - If your results need further discussion, our office will attempt to contact you via phone, and if unable to reach you after 2 attempts, we will release your abnormal result to your mychart with instructions.  - All results will be automatically released in mychart after 1 week.  - Your provider will provide you with explanation and instruction on all relevant material in your results. Please keep in mind, results and labs may appear confusing or abnormal to the untrained eye, but it does not mean they are actually abnormal for you personally. If you have any questions about your results that are not covered, or you desire more detailed explanation than what was provided, you should make an appointment with your provider to do so.   Our office handles many outgoing and incoming calls daily. If we have not contacted you within 1 week about your results, please check your mychart to see if there is a message first and if not, then contact our office.  In helping with this matter, you help decrease call volume, and therefore allow [place] to be able to respond to patients needs more efficiently.   Acute office visits (sick visit):  An acute visit is intended for a new problem and are scheduled in shorter time slots to allow schedule openings for patients with new problems. This is the appropriate visit to discuss a new problem. In order to provide you with excellent quality medical care with proper time for you to explain your problem, have an exam and receive treatment with instructions, these appointments should be limited to one new problem per visit. If you experience a new problem, in which you desire to be  addressed, please make an acute office visit, we save openings on the schedule to accommodate you. Please do not save your new problem for any other type of visit, let us take care of it properly and quickly for you.   Follow up visits:  Depending on your condition(s) your provider will need to see you routinely in order to provide you with quality care and prescribe medication(s). Most chronic conditions (Example: hypertension, Diabetes, depression/anxiety... etc), require visits a couple times a year. Your provider will instruct you on proper follow up for your personal medical conditions and history. Please make certain to make follow up appointments for your condition as instructed. Failing to do so could result in lapse in your medication treatment/refills. If you request a refill, and are overdue to be seen on a condition, we will always provide you with a 30 day script (once) to allow you time to schedule.    Medicare wellness (well visit): - we have a wonderful Nurse Selena Batten), that will meet with you and provide you will yearly medicare wellness visits. These visits should occur yearly (can not be scheduled less than 1 calendar year apart) and cover preventive health, immunizations, advance directives and screenings you are entitled to yearly through your medicare benefits. Do not miss out on your entitled benefits, this is when medicare will pay for these benefits to be ordered for you.  These are strongly encouraged by your provider and is the appropriate type of visit to make certain you are up to date with all preventive health benefits. If you have not had your medicare wellness exam in the last 12 months, please make certain to schedule one by calling the office and schedule your medicare wellness with Selena Batten as soon as possible.   Yearly physical (well visit):  - Adults are recommended to be seen yearly for physicals. Check with your insurance and date of your last physical, most insurances  require one calendar year between physicals. Physicals include all preventive health topics, screenings, medical exam and labs that are appropriate for gender/age and history. You may have fasting labs needed at this visit. This is a well visit (not a sick visit), new problems should not be covered during this visit (see acute visit).  - Pediatric patients are seen more frequently when they are younger. Your provider will advise you on well child visit timing that is appropriate for your their age. - This is not a medicare wellness visit. Medicare wellness exams do not have an exam portion to the visit. Some medicare companies allow for a physical, some do not allow a yearly physical. If your medicare allows a yearly physical you can schedule the medicare wellness with our nurse Selena Batten and have your physical with your provider after, on the same day. Please check with insurance for your full benefits.   Late Policy/No Shows:  - all new patients should arrive 15-30 minutes earlier than appointment to allow [place] time  to  obtain all personal demographics,  insurance information and for you to complete office paperwork. - All established patients should arrive 10-15 minutes earlier than appointment time to update all information and be checked in .  - In our best efforts to run on time, if you are late for your appointment you will be asked to either reschedule or if able, we will work you back into the schedule. There will be a wait time to work you back in the schedule,  depending on availability.  - If you are unable to make it to your appointment as scheduled, please call 24 hours ahead of time to allow [place] to fill the time slot with someone else who needs to be seen. If you do not cancel your appointment ahead of time, you may be charged a no show fee.

## 2017-06-08 LAB — MEASLES/MUMPS/RUBELLA IMMUNITY
MUMPS IGG: 13.2 [AU]/ml
RUBEOLA IGG: 53.6 [AU]/ml
Rubella: 1.74 index

## 2017-12-05 ENCOUNTER — Encounter: Payer: Self-pay | Admitting: Family Medicine

## 2017-12-05 ENCOUNTER — Ambulatory Visit: Payer: 59 | Admitting: Family Medicine

## 2017-12-05 VITALS — BP 123/79 | HR 59 | Temp 98.0°F | Resp 20 | Ht 70.0 in | Wt 174.6 lb

## 2017-12-05 DIAGNOSIS — Z23 Encounter for immunization: Secondary | ICD-10-CM | POA: Diagnosis not present

## 2017-12-05 DIAGNOSIS — I10 Essential (primary) hypertension: Secondary | ICD-10-CM

## 2017-12-05 MED ORDER — LISINOPRIL 10 MG PO TABS: 10.0000 mg | ORAL_TABLET | Freq: Every day | ORAL | 1 refills | Status: DC

## 2017-12-05 NOTE — Progress Notes (Signed)
Patient ID: Alejandro English, male  DOB: 04-11-1965, 52 y.o.   MRN: [id number] Patient Care Team    Relationship Specialty Notifications Start End  Natalia Leatherwood, DO PCP - General Family Medicine  12/02/16     Chief Complaint  Patient presents with  . Hypertension    Subjective:  Alejandro English is a 52 y.o.  male present for   Hypertension: Pt reports compliance with lisinopril 10 mg.  Blood pressures ranges at home not checked. Patient denies chest pain, shortness of breath, dizziness or lower extremity edema.   Pt is taking a daily baby ASA and has questions on need.  Pt is not prescribed statin.  BMP: 01/20/2017 WNL CBC: 01/20/2017 WNL  Lipid: 01/20/2017: WNL TSH: 01/20/2017 WNL Diet/exercise: Low-sodium, exercises routinely. RF: Hypertension   Depression screen [facility] 2/9 06/06/2017 12/02/2016  Decreased Interest 0 0  Down, Depressed, Hopeless 0 0  PHQ - 2 Score 0 0   No flowsheet data found.   Fall Risk  12/02/2016  Falls in the past year? No   Immunization History  Administered Date(s) Administered  . Influenza,inj,Quad PF,6+ Mos 12/02/2016, 12/05/2017  . Zoster Recombinat (Shingrix) 12/03/2016, 02/07/2017    No exam data present  Past Medical History:  Diagnosis Date  . Allergy   . Anal fissure 2005  . Chickenpox   . H/O syncope   . History of EKG 01/20/2017   normal  . History of UTI   . Hypertension 12/2016  . Internal hemorrhoids    follow by  Dr. Rhea Belton  . Kidney stone 2018   Incidental finding on left kidney ultrasound 2018.  . Vitamin D deficiency 2018   23   Allergies  Allergen Reactions  . Penicillins Rash   Past Surgical History:  Procedure Laterality Date  . ANAL FISTULOTOMY  2006  . COLONOSCOPY  07/02/2016   Normal, 10 year follow-up. Dr. Rhea Belton   Family History  Problem Relation Age of Onset  . Prostate cancer Father   . Diverticulitis Father        part of colon removed  . Hypertension Father   . Hypertension Brother   . Other  Maternal Grandmother        died at 66  . Brain cancer Maternal Grandfather   . Diverticulitis Paternal Grandmother   . Diabetes Mellitus I Daughter   . Colon cancer Neg Hx   . Stomach cancer Neg Hx   . Rectal cancer Neg Hx   . Esophageal cancer Neg Hx   . Liver cancer Neg Hx   . Colon polyps Neg Hx    Social History   Socioeconomic History  . Marital status: Divorced    Spouse name: Not on file  . Number of children: 2  . Years of education: 63  . Highest education level: Not on file  Occupational History  . Occupation: Nurse, learning disability Needs  . Financial resource strain: Not on file  . Food insecurity:    Worry: Not on file    Inability: Not on file  . Transportation needs:    Medical: Not on file    Non-medical: Not on file  Tobacco Use  . Smoking status: Never Smoker  . Smokeless tobacco: Never Used  Substance and Sexual Activity  . Alcohol use: Yes    Comment: has one drink per week  . Drug use: No  . Sexual activity: Never    Partners: Female  Lifestyle  . Physical activity:  Days per week: Not on file    Minutes per session: Not on file  . Stress: Not on file  Relationships  . Social connections:    Talks on phone: Not on file    Gets together: Not on file    Attends religious service: Not on file    Active member of club or organization: Not on file    Attends meetings of clubs or organizations: Not on file    Relationship status: Not on file  . Intimate partner violence:    Fear of current or ex partner: Not on file    Emotionally abused: Not on file    Physically abused: Not on file    Forced sexual activity: Not on file  Other Topics Concern  . Not on file  Social History Narrative   Single. Has 2 children. Works as a Gaffer.   MBA degree.   Wears a bicycle helmet. Wears his seatbelt.   Exercises routinely.   Allergies as of 12/05/2017      Reactions   Penicillins Rash      Medication List        Accurate as of  12/05/17  3:56 PM. Always use your most recent med list.          aspirin EC 81 MG tablet Take 81 mg daily by mouth.   lisinopril 10 MG tablet Commonly known as:  PRINIVIL,ZESTRIL Take 1 tablet (10 mg total) by mouth daily.       All past medical history, surgical history, allergies, family history, immunizations andmedications were updated in the EMR today and reviewed under the history and medication portions of their EMR.    No results found for this or any previous visit (from the past 2160 hour(s)).  Patient was never admitted.   ROS: 14 pt review of systems performed and negative (unless mentioned in an HPI)  Objective: BP 123/79 (BP Location: Right Arm, Patient Position: Sitting, Cuff Size: Large)   Pulse (!) 59   Temp 98 F (36.7 C)   Resp 20   Ht 5\' 10"  (1.778 m)   Wt 174 lb 9.6 oz (79.2 kg)   SpO2 99%   BMI 25.05 kg/m  Gen: Afebrile. No acute distress. nontoxic HENT: AT. [place]. MMM.  Eyes:Pupils Equal Round Reactive to light, Extraocular movements intact,  Conjunctiva without redness, discharge or icterus. Neck/lymp/endocrine: Supple,no lymphadenopathy, no thyromegaly CV: RRR no murmur, no edema, +2/4 P posterior tibialis pulses Chest: CTAB, no wheeze or crackles Abd: Soft.  NTND. BS present Neuro: Normal gait. PERLA. EOMi. Alert. Oriented 3 Psych: Normal affect, dress and demeanor. Normal speech. Normal thought content and judgment.     Assessment/plan: Alejandro English is a 52 y.o. male present for establish care with elevated BP.  Essential hypertension: - Stable. Refills provided lisinopril 10 mg QD.  - dicussed benefits vs risk with ASA 81 mg.  - labs are due dec--> he has them collected at [employer] w/ his company.  - Follow-up 6 months  Flu shot provided today  Return in about 6 months (around 06/05/2018) for HTN'.  > 15 minutes spent with patient, >50% of time spent face to face    Note is dictated utilizing voice recognition software. Although  note has been proof read prior to signing, occasional typographical errors still can be missed. If any questions arise, please do not hesitate to call for verification.  Electronically signed by: Felix Pacini, DO [facility] [place]

## 2017-12-05 NOTE — Patient Instructions (Signed)
Good luck! Congrats!!! Your blood pressure looks great. I have refilled your meds .   Please help [place] help you:  We are honored you have chosen [facility] for your Primary Care home. Below you will find basic instructions that you may need to access in the future. Please help [place] help you by reading the instructions, which cover many of the frequent questions we experience.   Prescription refills and request:  -In order to allow more efficient response time, please call your pharmacy for all refills. They will forward the request electronically to [place]. This allows for the quickest possible response. Request left on a nurse line can take longer to refill, since these are checked as time allows between office patients and other phone calls.  - refill request can take up to 3-5 working days to complete.  - If request is sent electronically and request is appropiate, it is usually completed in 1-2 business days.  - all patients will need to be seen routinely for all chronic medical conditions requiring prescription medications (see follow-up below). If you are overdue for follow up on your condition, you will be asked to make an appointment and we will call in enough medication to cover you until your appointment (up to 30 days).  - all controlled substances will require a face to face visit to request/refill.  - if you desire your prescriptions to go through a new pharmacy, and have an active script at original pharmacy, you will need to call your pharmacy and have scripts transferred to new pharmacy. This is completed between the pharmacy locations and not by your provider.    Results: If any images or labs were ordered, it can take up to 1 week to get results depending on the test ordered and the lab/facility running and resulting the test. - Normal or stable results, which do not need further discussion, may be released to your mychart immediately with attached note to you. A call may not be  generated for normal results. Please make certain to sign up for mychart. If you have questions on how to activate your mychart you can call the front office.  - If your results need further discussion, our office will attempt to contact you via phone, and if unable to reach you after 2 attempts, we will release your abnormal result to your mychart with instructions.  - All results will be automatically released in mychart after 1 week.  - Your provider will provide you with explanation and instruction on all relevant material in your results. Please keep in mind, results and labs may appear confusing or abnormal to the untrained eye, but it does not mean they are actually abnormal for you personally. If you have any questions about your results that are not covered, or you desire more detailed explanation than what was provided, you should make an appointment with your provider to do so.   Our office handles many outgoing and incoming calls daily. If we have not contacted you within 1 week about your results, please check your mychart to see if there is a message first and if not, then contact our office.  In helping with this matter, you help decrease call volume, and therefore allow [place] to be able to respond to patients needs more efficiently.   Acute office visits (sick visit):  An acute visit is intended for a new problem and are scheduled in shorter time slots to allow schedule openings for patients with new problems.  This is the appropriate visit to discuss a new problem. Problems will not be addressed by phone call or Echart message. Appointment is needed if requesting treatment. In order to provide you with excellent quality medical care with proper time for you to explain your problem, have an exam and receive treatment with instructions, these appointments should be limited to one new problem per visit. If you experience a new problem, in which you desire to be addressed, please make an acute  office visit, we save openings on the schedule to accommodate you. Please do not save your new problem for any other type of visit, let us take care of it properly and quickly for you.   Follow up visits:  Depending on your condition(s) your provider will need to see you routinely in order to provide you with quality care and prescribe medication(s). Most chronic conditions (Example: hypertension, Diabetes, depression/anxiety... etc), require visits a couple times a year. Your provider will instruct you on proper follow up for your personal medical conditions and history. Please make certain to make follow up appointments for your condition as instructed. Failing to do so could result in lapse in your medication treatment/refills. If you request a refill, and are overdue to be seen on a condition, we will always provide you with a 30 day script (once) to allow you time to schedule.    Medicare wellness (well visit): - we have a wonderful Nurse Selena Batten), that will meet with you and provide you will yearly medicare wellness visits. These visits should occur yearly (can not be scheduled less than 1 calendar year apart) and cover preventive health, immunizations, advance directives and screenings you are entitled to yearly through your medicare benefits. Do not miss out on your entitled benefits, this is when medicare will pay for these benefits to be ordered for you.  These are strongly encouraged by your provider and is the appropriate type of visit to make certain you are up to date with all preventive health benefits. If you have not had your medicare wellness exam in the last 12 months, please make certain to schedule one by calling the office and schedule your medicare wellness with Selena Batten as soon as possible.   Yearly physical (well visit):  - Adults are recommended to be seen yearly for physicals. Check with your insurance and date of your last physical, most insurances require one calendar year between  physicals. Physicals include all preventive health topics, screenings, medical exam and labs that are appropriate for gender/age and history. You may have fasting labs needed at this visit. This is a well visit (not a sick visit), new problems should not be covered during this visit (see acute visit).  - Pediatric patients are seen more frequently when they are younger. Your provider will advise you on well child visit timing that is appropriate for your their age. - This is not a medicare wellness visit. Medicare wellness exams do not have an exam portion to the visit. Some medicare companies allow for a physical, some do not allow a yearly physical. If your medicare allows a yearly physical you can schedule the medicare wellness with our nurse Selena Batten and have your physical with your provider after, on the same day. Please check with insurance for your full benefits.   Late Policy/No Shows:  - all new patients should arrive 15-30 minutes earlier than appointment to allow [place] time  to  obtain all personal demographics,  insurance information and for you to complete office  paperwork. - All established patients should arrive 10-15 minutes earlier than appointment time to update all information and be checked in .  - In our best efforts to run on time, if you are late for your appointment you will be asked to either reschedule or if able, we will work you back into the schedule. There will be a wait time to work you back in the schedule,  depending on availability.  - If you are unable to make it to your appointment as scheduled, please call 24 hours ahead of time to allow [place] to fill the time slot with someone else who needs to be seen. If you do not cancel your appointment ahead of time, you may be charged a no show fee.

## 2018-04-10 ENCOUNTER — Encounter: Payer: Self-pay | Admitting: Family Medicine

## 2018-04-10 LAB — LIPID PANEL
Cholesterol: 154 (ref 0–200)
HDL: 43 (ref 35–70)
LDL Cholesterol: 105
Triglycerides: 30 — AB (ref 40–160)

## 2018-04-10 LAB — CBC AND DIFFERENTIAL
Hemoglobin: 16 (ref 13.5–17.5)
Platelets: 159 (ref 150–399)
WBC: 5.1

## 2018-04-10 LAB — PSA: PSA: 1.61

## 2018-04-10 LAB — HEPATIC FUNCTION PANEL
ALT: 19 (ref 10–40)
AST: 22 (ref 14–40)

## 2018-04-10 LAB — BASIC METABOLIC PANEL
BUN: 24 — AB (ref 4–21)
Creatinine: 1.3 (ref <=1.3)
Glucose: 102
Potassium: 4.7 (ref 3.4–5.3)
Sodium: 141 (ref 137–147)

## 2018-04-10 LAB — TSH: TSH: 1.45 (ref 0.41–5.90)

## 2018-04-10 LAB — POCT ERYTHROCYTE SEDIMENTATION RATE, NON-AUTOMATED: Sed Rate: 4

## 2018-04-10 LAB — VITAMIN B12: Vitamin B-12: 811

## 2018-04-10 LAB — HM DEXA SCAN

## 2018-04-10 LAB — VITAMIN D 25 HYDROXY (VIT D DEFICIENCY, FRACTURES): Vit D, 25-Hydroxy: 23.45

## 2018-04-10 LAB — IRON,TIBC AND FERRITIN PANEL: Ferritin: 107

## 2018-04-10 LAB — HEMOGLOBIN A1C: Hemoglobin A1C: 5.1

## 2018-04-11 DIAGNOSIS — E559 Vitamin D deficiency, unspecified: Secondary | ICD-10-CM | POA: Insufficient documentation

## 2018-04-12 ENCOUNTER — Encounter: Payer: Self-pay | Admitting: Family Medicine

## 2018-05-03 ENCOUNTER — Encounter: Payer: Self-pay | Admitting: Family Medicine

## 2018-05-06 ENCOUNTER — Encounter: Payer: Self-pay | Admitting: Family Medicine

## 2018-06-05 ENCOUNTER — Encounter: Payer: Self-pay | Admitting: Family Medicine

## 2018-06-05 ENCOUNTER — Ambulatory Visit (INDEPENDENT_AMBULATORY_CARE_PROVIDER_SITE_OTHER): Payer: 59 | Admitting: Family Medicine

## 2018-06-05 VITALS — BP 113/65 | HR 58 | Ht 70.0 in | Wt 175.5 lb

## 2018-06-05 DIAGNOSIS — E559 Vitamin D deficiency, unspecified: Secondary | ICD-10-CM

## 2018-06-05 DIAGNOSIS — Z23 Encounter for immunization: Secondary | ICD-10-CM

## 2018-06-05 DIAGNOSIS — M810 Age-related osteoporosis without current pathological fracture: Secondary | ICD-10-CM

## 2018-06-05 DIAGNOSIS — I10 Essential (primary) hypertension: Secondary | ICD-10-CM | POA: Diagnosis not present

## 2018-06-05 NOTE — Progress Notes (Signed)
VIRTUAL VISIT VIA VIDEO  I connected with Alejandro Merl on 06/05/18 at  8:40 AM EDT by a video enabled telemedicine application and verified that I am speaking with the correct person using two identifiers. Location patient: Home Location provider: [facility], Office Persons participating in the virtual visit: Patient, Dr. Claiborne Billings and R.Baker, LPN  I discussed the limitations of evaluation and management by telemedicine and the availability of in person appointments. The patient expressed understanding and agreed to proceed.   SUBJECTIVE Chief Complaint  Patient presents with  . Hypertension    BP has been WNL. No concerns. BP when he woke up before medication 112/71, 53 pulse. Pt had CPE at [employer] from work. He states he was started on Vit D and calcium and would like to discuss this     HPI: Hypertension: Reviewed all labs and imaging studies completed at [place] through his company. Pt reports  plants with lisinopril 10 mg.  Blood pressures ranges at home within normal limits. Patient denies chest pain, shortness of breath, dizziness or lower extremity edema.   Pt is taking a daily baby ASA and has questions on need.  Pt is not prescribed statin.  BMP: 04/10/2018 within normal limits CBC: 04/10/2018 within normal limits Lipid: 04/10/2018 total cholesterol 154, HDL 43, LDL 105, triglycerides 30 TSH: 04/10/2018 within normal limits Diet/exercise: Low-sodium, exercises routinely. RF: Hypertension  Vit D deficiency/osteoporosis:  Vitamin D 23 04/10/2018 checked at outside lab.  He was prescribed vitamin D 50,000 units once weekly for 7 to 8 weeks.  He also had a DEXA scan completed which resulted with -2.9 T score lower vertebrae and -1.7 in his left femur. He has had not for back trauma or surgeries.  He does have occasional low lumbar back discomfort but nothing serious.  His PSA was normal.  He also had a negative celiac panel during that work-up in March as well.  He has not  had any dedicated lower lumbar x-rays. ROS: See pertinent positives and negatives per HPI.  Patient Active Problem List   Diagnosis Date Noted  . Osteoporosis 02/01/2017  . Essential hypertension 12/02/2016    Social History   Tobacco Use  . Smoking status: Never Smoker  . Smokeless tobacco: Never Used  Substance Use Topics  . Alcohol use: Yes    Comment: has one drink per week    Current Outpatient Medications:  .  aspirin EC 81 MG tablet, Take 81 mg daily by mouth., Disp: , Rfl:  .  Calcium Carbonate-Vit D-Min (CALCIUM 1200 PO), Take 1 tablet by mouth daily., Disp: , Rfl:  .  lisinopril (PRINIVIL,ZESTRIL) 10 MG tablet, Take 1 tablet (10 mg total) by mouth daily., Disp: 90 tablet, Rfl: 1 .  Vitamin D, Ergocalciferol, (DRISDOL) 1.25 MG (50000 UT) CAPS capsule, TAKE 1 CAPSULE BY MOUTH ONE TIME PER WEEK, Disp: , Rfl:   Allergies  Allergen Reactions  . Penicillins Rash    OBJECTIVE: BP 113/65   Pulse (!) 58   Ht 5\' 10"  (1.778 m)   Wt 175 lb 8 oz (79.6 kg)   BMI 25.18 kg/m  Gen: No acute distress. Nontoxic in appearance.  HENT: AT. [place].  MMM.  Eyes:Pupils Equal Round Reactive to light, Extraocular movements intact,  Conjunctiva without redness, discharge or icterus. CV: no edema Chest: Cough or shortness of breath not present Skin: no rashes, purpura or petechiae.  Neuro:  Alert. Oriented x3  Psych: Normal affect, dress and demeanor. Normal speech. Normal  thought content and judgment.  ASSESSMENT AND PLAN: Alejandro English is a 53 y.o. male present for  Essential hypertension - stable. Refills provided lisinopril 10 mg QD.  - dicussed benefits vs risk with ASA 81 mg.  - diet and routine exercise recommended.  - labs UTD - Follow-up 6 months  Osteoporosis without current pathological fracture, unspecified osteoporosis type/ Vitamin D deficiency - no known reasoning for low vit d and osteoporosis on DEXA of lumbar spine and osteopenia of left femoral neck.  - negative  celiac panel. No reports of frequent diarrhea.  - collect vit d after 7 weeks of supplement and r/o PTH dysfunction.  - Vitamin D (25 hydroxy); Future - PTH, Intact and Calcium; Future - consider fosamax start- discussed today. Await lab results.  - consider lumbar xray.  - f/u dependent on lab results.   immunization due:  Due for Tdap update. Schedule for nurse visit for immunization and lab appt in 2 weeks.      > 25 minutes spent with patient, >50% of time spent face to face counseling and/or coordinating care.      Felix Pacinienee Kuneff, DO 06/05/2018

## 2018-06-19 ENCOUNTER — Ambulatory Visit (INDEPENDENT_AMBULATORY_CARE_PROVIDER_SITE_OTHER): Payer: 59 | Admitting: Family Medicine

## 2018-06-19 ENCOUNTER — Other Ambulatory Visit: Payer: Self-pay

## 2018-06-19 ENCOUNTER — Other Ambulatory Visit: Payer: 59

## 2018-06-19 DIAGNOSIS — Z23 Encounter for immunization: Secondary | ICD-10-CM | POA: Diagnosis not present

## 2018-06-19 DIAGNOSIS — E559 Vitamin D deficiency, unspecified: Secondary | ICD-10-CM

## 2018-06-19 DIAGNOSIS — M810 Age-related osteoporosis without current pathological fracture: Secondary | ICD-10-CM

## 2018-06-19 LAB — VITAMIN D 25 HYDROXY (VIT D DEFICIENCY, FRACTURES): VITD: 47.4 ng/mL (ref 30.00–100.00)

## 2018-06-19 MED ORDER — LISINOPRIL 10 MG PO TABS: 10.0000 mg | ORAL_TABLET | Freq: Every day | ORAL | 1 refills | Status: DC

## 2018-06-19 NOTE — Addendum Note (Signed)
Addended by: Daryel November L on: 06/19/2018 11:13 AM   Modules accepted: Orders

## 2018-06-21 ENCOUNTER — Telehealth: Payer: Self-pay | Admitting: Family Medicine

## 2018-06-21 DIAGNOSIS — R7989 Other specified abnormal findings of blood chemistry: Secondary | ICD-10-CM | POA: Insufficient documentation

## 2018-06-21 LAB — PTH, INTACT AND CALCIUM
Calcium: 9.6 mg/dL (ref 8.6–10.3)
PTH: 9 pg/mL — ABNORMAL LOW (ref 14–64)

## 2018-06-21 NOTE — Telephone Encounter (Signed)
Spoke with patient this evening concerning his lab results.  He will call back or echart Thursday the correct doses of Vit D and calcium he is taking a day.  Once I receive those doses, I will have our office call back with instructions on ca/vit d dose, tx for osteoporosis and schedule repeat labs in 4 weeks.

## 2018-06-22 ENCOUNTER — Telehealth: Payer: Self-pay | Admitting: Family Medicine

## 2018-06-22 ENCOUNTER — Encounter: Payer: Self-pay | Admitting: Family Medicine

## 2018-06-22 DIAGNOSIS — E559 Vitamin D deficiency, unspecified: Secondary | ICD-10-CM

## 2018-06-22 DIAGNOSIS — M810 Age-related osteoporosis without current pathological fracture: Secondary | ICD-10-CM

## 2018-06-22 DIAGNOSIS — R7989 Other specified abnormal findings of blood chemistry: Secondary | ICD-10-CM

## 2018-06-22 NOTE — Telephone Encounter (Signed)
Pt wrote the following my chart message: Dr. Claiborne Billings wanted information on the calcium supplement recommended to me by Dr. Hyacinth Meeker at the [facility]. Since that visit is early March I've been taking two Viactiv chewable daily. One chewable contains 650 mg Calcium and 12.5 mcg (500 IU) of Vitamin D. So daily that's 1300 mg Calcium and 25 mcg (1000 IU) of Vitamin D. Dr. Hyacinth Meeker also prescribed Vitamin D2 (1.25 MG, 50,000 Unit) that I take once a week on Sunday. I have 2 of these left.  Thanks  Please advise

## 2018-06-22 NOTE — Telephone Encounter (Signed)
See other note- Appt made for labs a F/U, results/instructions given to pt, he verbalized understanding

## 2018-06-22 NOTE — Telephone Encounter (Signed)
Pt was called and given information he verbalized understanding. Appts were made for labs and F/U appt

## 2018-06-22 NOTE — Telephone Encounter (Signed)
Please inform patient the following information:  - stop the weekly high dose Vit D.  - Reduce chewable to either 1 1/2 chew or purchase separate calcium [phone] and vit d 600-800u daily.  - retest labs in 4 weeks-lab appt only-  orders placed (not fasting). Follow up 5-7 days after lab draw w/ provider so we can go ver results and discuss plan.  (both must be scheduled) - I would also suggest waiting on starting fosamax or like med for his osteoporosis until we see what his labs are in 4 weeks.

## 2018-06-23 ENCOUNTER — Ambulatory Visit: Payer: 59 | Admitting: Family Medicine

## 2018-07-17 ENCOUNTER — Other Ambulatory Visit: Payer: Self-pay

## 2018-07-17 ENCOUNTER — Ambulatory Visit (INDEPENDENT_AMBULATORY_CARE_PROVIDER_SITE_OTHER): Payer: 59 | Admitting: Family Medicine

## 2018-07-17 DIAGNOSIS — E559 Vitamin D deficiency, unspecified: Secondary | ICD-10-CM | POA: Diagnosis not present

## 2018-07-17 DIAGNOSIS — M810 Age-related osteoporosis without current pathological fracture: Secondary | ICD-10-CM

## 2018-07-17 DIAGNOSIS — R7989 Other specified abnormal findings of blood chemistry: Secondary | ICD-10-CM | POA: Diagnosis not present

## 2018-07-17 LAB — MAGNESIUM: Magnesium: 1.9 mg/dL (ref 1.5–2.5)

## 2018-07-17 LAB — VITAMIN D 25 HYDROXY (VIT D DEFICIENCY, FRACTURES): VITD: 47.33 ng/mL (ref 30.00–100.00)

## 2018-07-17 LAB — PHOSPHORUS: Phosphorus: 2 mg/dL — ABNORMAL LOW (ref 2.3–4.6)

## 2018-07-19 LAB — PTH, INTACT AND CALCIUM
Calcium: 9.3 mg/dL (ref 8.6–10.3)
PTH: 11 pg/mL — ABNORMAL LOW (ref 14–64)

## 2018-07-24 ENCOUNTER — Ambulatory Visit: Payer: 59 | Admitting: Family Medicine

## 2018-07-25 ENCOUNTER — Ambulatory Visit (INDEPENDENT_AMBULATORY_CARE_PROVIDER_SITE_OTHER): Payer: 59 | Admitting: Family Medicine

## 2018-07-25 ENCOUNTER — Encounter: Payer: Self-pay | Admitting: Family Medicine

## 2018-07-25 ENCOUNTER — Other Ambulatory Visit: Payer: Self-pay

## 2018-07-25 VITALS — BP 106/68 | HR 66 | Ht 70.0 in | Wt 174.0 lb

## 2018-07-25 DIAGNOSIS — M810 Age-related osteoporosis without current pathological fracture: Secondary | ICD-10-CM

## 2018-07-25 DIAGNOSIS — E209 Hypoparathyroidism, unspecified: Secondary | ICD-10-CM | POA: Insufficient documentation

## 2018-07-25 DIAGNOSIS — E559 Vitamin D deficiency, unspecified: Secondary | ICD-10-CM | POA: Diagnosis not present

## 2018-07-25 NOTE — Progress Notes (Signed)
VIRTUAL VISIT VIA VIDEO  I connected with Alejandro English on 07/25/18 at  8:30 AM EDT by a video enabled telemedicine application and verified that I am speaking with the correct person using two identifiers. Location patient: Home Location provider: [facility], Office Persons participating in the virtual visit: Patient, Dr. Raoul Pitch and R.Baker, LPN  I discussed the limitations of evaluation and management by telemedicine and the availability of in person appointments. The patient expressed understanding and agreed to proceed.   SUBJECTIVE Chief Complaint  Patient presents with  . Follow-up    Pt would like to review lab work. Pt states it is working better for him not taking the Vit D.     HPI: Alejandro English is a 53 y.o. male present to discuss his osteoporosis.  Vit D deficiency/osteoporosis:  prior labs are under the media tab collected at his [employer]. He is now only taking the OTC calcium and vit d supplement. Today we are discussing labs and treatment plan. Phos < 2.0, mag 1.9, calcium 9.3, vit d 47, PTH 11.  Prior note:  Vitamin D 23 04/10/2018 checked at outside lab.  He was prescribed vitamin D 50,000 units once weekly for 7 to 8 weeks.  He also had a DEXA scan completed which resulted with -2.9 T score lower vertebrae and -1.7 in his left femur. He has not had back trauma or surgeries. He has not had neck surgeries.  He does have occasional low lumbar back discomfort but nothing serious.  His PSA was normal.  He also had a negative celiac panel during that work-up in March as well.  He has not had any dedicated lower lumbar x-rays.  ROS: See pertinent positives and negatives per HPI.  Patient Active Problem List   Diagnosis Date Noted  . Low serum parathyroid hormone (PTH) 06/21/2018  . Vitamin D deficiency 04/11/2018  . Osteoporosis 02/01/2017  . Essential hypertension 12/02/2016    Social History   Tobacco Use  . Smoking status: Never Smoker  . Smokeless  tobacco: Never Used  Substance Use Topics  . Alcohol use: Yes    Comment: has one drink per week    Current Outpatient Medications:  .  aspirin EC 81 MG tablet, Take 81 mg daily by mouth., Disp: , Rfl:  .  lisinopril (ZESTRIL) 10 MG tablet, Take 1 tablet (10 mg total) by mouth daily., Disp: 90 tablet, Rfl: 1 .  Calcium Carbonate-Vit D-Min (CALCIUM 1200 PO), Take 1 tablet by mouth daily. 1.5 tablets daily, Disp: , Rfl:  .  Vitamin D, Ergocalciferol, (DRISDOL) 1.25 MG (50000 UT) CAPS capsule, TAKE 1 CAPSULE BY MOUTH ONE TIME PER WEEK, Disp: , Rfl:   Allergies  Allergen Reactions  . Penicillins Rash    OBJECTIVE: BP 106/68   Pulse 66   Ht 5\' 10"  (1.778 m)   Wt 174 lb (78.9 kg)   BMI 24.97 kg/m  Gen: No acute distress. Nontoxic in appearance.  HENT: AT. .  MMM.  Eyes:Pupils Equal Round Reactive to light, Extraocular movements intact,  Conjunctiva without redness, discharge or icterus. Chest: Cough or shortness of breath not present.  Skin: no rashes, purpura or petechiae.  Neuro:  Alert. Oriented x3  Psych: Normal affect, dress and demeanor. Normal speech. Normal thought content and judgment.  ASSESSMENT AND PLAN: Alejandro English is a 54 y.o. male present for  Hypoparathyroidism (HCC)/Osteoporosis without current pathological fracture/ Vitamin D deficiency/Low phosphate levels - osteoporosis on DEXA of lumbar spine and  osteopenia of left femoral neck.  - PTH was normal in March- dropped after vit d start to 9>>then 11.  - phos <2.0, mag 1.9, calcium normal 9.6>>9.3, vit d 47 with supplement.  - negative celiac panel.   - consider fosamax start- will wait till full work up with endo - add QOD OTC low dose magnesium. Caution on loose stools.  - continue OTC ca/Vit d  - Ambulatory referral to Endocrinology    John D. Dingell Va Medical CenterRenee Kuneff, DO 07/25/2018

## 2018-09-07 ENCOUNTER — Other Ambulatory Visit: Payer: Self-pay

## 2018-09-11 ENCOUNTER — Ambulatory Visit: Payer: 59 | Admitting: Internal Medicine

## 2018-09-11 ENCOUNTER — Other Ambulatory Visit: Payer: Self-pay

## 2018-09-11 ENCOUNTER — Encounter: Payer: Self-pay | Admitting: Internal Medicine

## 2018-09-11 ENCOUNTER — Ambulatory Visit
Admission: RE | Admit: 2018-09-11 | Discharge: 2018-09-11 | Disposition: A | Discharge status: Home / Self Care | Payer: 59 | Source: Ambulatory Visit | Attending: Internal Medicine | Admitting: Internal Medicine

## 2018-09-11 VITALS — BP 142/82 | HR 75 | Temp 98.2°F | Ht 70.0 in | Wt 175.4 lb

## 2018-09-11 DIAGNOSIS — E559 Vitamin D deficiency, unspecified: Secondary | ICD-10-CM | POA: Diagnosis not present

## 2018-09-11 DIAGNOSIS — M839 Adult osteomalacia, unspecified: Secondary | ICD-10-CM

## 2018-09-11 LAB — MAGNESIUM: Magnesium: 2.1 mg/dL (ref 1.5–2.5)

## 2018-09-11 LAB — COMPREHENSIVE METABOLIC PANEL
ALT: 18 U/L (ref 0–53)
AST: 19 U/L (ref 0–37)
Albumin: 4.7 g/dL (ref 3.5–5.2)
Alkaline Phosphatase: 72 U/L (ref 39–117)
BUN: 22 mg/dL (ref 6–23)
CO2: 29 mEq/L (ref 19–32)
Calcium: 10 mg/dL (ref 8.4–10.5)
Chloride: 103 mEq/L (ref 96–112)
Creatinine, Ser: 1.15 mg/dL (ref 0.40–1.50)
GFR: 66.54 mL/min (ref >60.00)
Glucose, Bld: 88 mg/dL (ref 70–99)
Potassium: 4.2 mEq/L (ref 3.5–5.1)
Sodium: 140 mEq/L (ref 135–145)
Total Bilirubin: 0.6 mg/dL (ref 0.2–1.2)
Total Protein: 7.6 g/dL (ref 6.0–8.3)

## 2018-09-11 LAB — VITAMIN D 25 HYDROXY (VIT D DEFICIENCY, FRACTURES): VITD: 42.64 ng/mL (ref 30.00–100.00)

## 2018-09-11 LAB — PHOSPHORUS: Phosphorus: 2.3 mg/dL (ref 2.3–4.6)

## 2018-09-11 NOTE — Patient Instructions (Signed)
-   Please stop by the lab today  - Continue current dose of Viactive and magnesium  - Please stop by [facility] for a spine X-ray
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
## 2018-09-13 ENCOUNTER — Other Ambulatory Visit: Payer: 59

## 2018-09-13 ENCOUNTER — Other Ambulatory Visit: Payer: Self-pay

## 2018-09-13 DIAGNOSIS — M839 Adult osteomalacia, unspecified: Secondary | ICD-10-CM

## 2018-09-13 DIAGNOSIS — E559 Vitamin D deficiency, unspecified: Secondary | ICD-10-CM | POA: Insufficient documentation

## 2018-09-13 HISTORY — DX: Other disorders of phosphorus metabolism: E83.39

## 2018-09-17 LAB — CALCIUM, IONIZED: Calcium, Ion: 5.1 mg/dL (ref 4.8–5.6)

## 2018-09-17 LAB — VITAMIN D 1,25 DIHYDROXY
Vitamin D 1, 25 (OH)2 Total: 28 pg/mL (ref 18–72)
Vitamin D2 1, 25 (OH)2: <8 pg/mL
Vitamin D3 1, 25 (OH)2: 28 pg/mL

## 2018-09-17 LAB — PTH, INTACT AND CALCIUM
Calcium: 10.1 mg/dL (ref 8.6–10.3)
PTH: 14 pg/mL (ref 14–64)

## 2018-09-20 ENCOUNTER — Other Ambulatory Visit: Payer: Self-pay

## 2018-09-20 LAB — CALCIUM, URINE, 24 HOUR: Calcium, 24H Urine: 181 mg/24 h

## 2018-09-20 LAB — PHOSPHORUS, URINE, TIMED
Phosphate, 24 Hour Urine: 1338 mg/24 h
Total Volume: 1300 mL

## 2018-09-20 LAB — CREATININE, URINE, 24 HOUR: Creatinine, 24H Ur: 1.74 g/(24.h) (ref 0.50–2.15)

## 2018-09-25 ENCOUNTER — Telehealth: Payer: Self-pay | Admitting: Internal Medicine

## 2018-09-25 DIAGNOSIS — M839 Adult osteomalacia, unspecified: Secondary | ICD-10-CM

## 2018-09-25 NOTE — Telephone Encounter (Signed)
appt made for 10/02/18@9  a.m. lft vm informing pt

## 2018-09-25 NOTE — Telephone Encounter (Signed)
Discussed all results with the patient   24-hr urine phosphorus 1338 mg/24 hrs ( reference [phone])   Prior to proceeding any further with this and given the inconsistent biochemical with clinical picture I would like to repeat his DXA and he is in agreement of this.    If osteomalacia is confirmed will ask him to stop all MVI and rest blood and urine and consider whole body scan   Abby Nena Jordan, MD  [facility]  [facility] [address] [place], [place] [address] Phone: [phone] FAX: [phone]

## 2018-10-02 ENCOUNTER — Other Ambulatory Visit: Payer: Self-pay

## 2018-10-02 ENCOUNTER — Ambulatory Visit (INDEPENDENT_AMBULATORY_CARE_PROVIDER_SITE_OTHER)
Admission: RE | Admit: 2018-10-02 | Discharge: 2018-10-02 | Disposition: A | Discharge status: Home / Self Care | Payer: 59 | Source: Ambulatory Visit | Attending: Internal Medicine | Admitting: Internal Medicine

## 2018-10-02 DIAGNOSIS — M839 Adult osteomalacia, unspecified: Secondary | ICD-10-CM | POA: Diagnosis not present

## 2018-10-04 ENCOUNTER — Telehealth: Payer: Self-pay | Admitting: Internal Medicine

## 2018-10-04 DIAGNOSIS — M816 Localized osteoporosis [Lequesne]: Secondary | ICD-10-CM

## 2018-10-04 NOTE — Telephone Encounter (Signed)
Left a message to call back at 11:30 AM     Sobieski, MD  [facility]  [facility] [address] [place], [place] [address] Phone: [phone] FAX: [phone]

## 2018-10-06 NOTE — Telephone Encounter (Signed)
Pt is calling back to speak with Dr. Kelton Pillar please call him on his cell phone  ATC Dr. Quin Hoop office, no answer.

## 2018-10-06 NOTE — Telephone Encounter (Signed)
Pt is waiting for your call

## 2018-10-06 NOTE — Telephone Encounter (Signed)
Discussed the need to stop all supplements and to present for lab/urine testing in 3 weeks  Pt advised to present fasting

## 2018-10-06 NOTE — Telephone Encounter (Signed)
Can you please see if you can get hold of him. Left him a message 2 days ago and no call back.   If you get hold of him , transfer him to me   Thanks  Ext [phone]

## 2018-10-06 NOTE — Telephone Encounter (Signed)
Attempted to reach pt.

## 2018-10-10 ENCOUNTER — Other Ambulatory Visit: Payer: Self-pay | Admitting: Internal Medicine

## 2018-10-10 DIAGNOSIS — M839 Adult osteomalacia, unspecified: Secondary | ICD-10-CM

## 2018-10-10 NOTE — Progress Notes (Signed)
FEPO4 = 38.44 while on phosphate supplements.     Pt will stop all supplements and repeat labs in 3 weeks

## 2018-10-20 ENCOUNTER — Other Ambulatory Visit: Payer: Self-pay

## 2018-10-20 ENCOUNTER — Ambulatory Visit (INDEPENDENT_AMBULATORY_CARE_PROVIDER_SITE_OTHER): Payer: 59

## 2018-10-20 DIAGNOSIS — Z23 Encounter for immunization: Secondary | ICD-10-CM | POA: Diagnosis not present

## 2018-10-30 ENCOUNTER — Other Ambulatory Visit (INDEPENDENT_AMBULATORY_CARE_PROVIDER_SITE_OTHER): Payer: 59

## 2018-10-30 ENCOUNTER — Other Ambulatory Visit: Payer: Self-pay

## 2018-10-30 DIAGNOSIS — M816 Localized osteoporosis [Lequesne]: Secondary | ICD-10-CM

## 2018-10-30 DIAGNOSIS — M839 Adult osteomalacia, unspecified: Secondary | ICD-10-CM | POA: Diagnosis not present

## 2018-10-30 LAB — MAGNESIUM: Magnesium: 2.2 mg/dL (ref 1.5–2.5)

## 2018-10-30 LAB — COMPREHENSIVE METABOLIC PANEL
ALT: 17 U/L (ref 0–53)
AST: 19 U/L (ref 0–37)
Albumin: 4.5 g/dL (ref 3.5–5.2)
Alkaline Phosphatase: 72 U/L (ref 39–117)
BUN: 21 mg/dL (ref 6–23)
CO2: 30 mEq/L (ref 19–32)
Calcium: 9.6 mg/dL (ref 8.4–10.5)
Chloride: 104 mEq/L (ref 96–112)
Creatinine, Ser: 1.14 mg/dL (ref 0.40–1.50)
GFR: 67.18 mL/min (ref >60.00)
Glucose, Bld: 95 mg/dL (ref 70–99)
Potassium: 4.4 mEq/L (ref 3.5–5.1)
Sodium: 141 mEq/L (ref 135–145)
Total Bilirubin: 0.8 mg/dL (ref 0.2–1.2)
Total Protein: 7.5 g/dL (ref 6.0–8.3)

## 2018-10-30 LAB — VITAMIN D 25 HYDROXY (VIT D DEFICIENCY, FRACTURES): VITD: 45.22 ng/mL (ref 30.00–100.00)

## 2018-10-30 LAB — PHOSPHORUS: Phosphorus: 2.5 mg/dL (ref 2.3–4.6)

## 2018-11-02 LAB — PROTEIN ELECTROPHORESIS, URINE REFLEX
Albumin ELP, Urine: 22.7 %
Alpha-1-Globulin, U: 5.2 %
Alpha-2-Globulin, U: 17.1 %
Beta Globulin, U: 35.9 %
Gamma Globulin, U: 19 %
Protein, Ur: 14.3 mg/dL

## 2018-11-03 LAB — CREATININE, URINE, RANDOM: Creatinine, Urine: 242 mg/dL (ref 20–320)

## 2018-11-03 LAB — PROTEIN ELECTROPHORESIS, SERUM, WITH REFLEX
Albumin ELP: 4.2 g/dL (ref 3.8–4.8)
Alpha 1: 0.3 g/dL (ref 0.2–0.3)
Alpha 2: 0.6 g/dL (ref 0.5–0.9)
Beta 2: 0.5 g/dL (ref 0.2–0.5)
Beta Globulin: 0.5 g/dL (ref 0.4–0.6)
Gamma Globulin: 1.1 g/dL (ref 0.8–1.7)
Total Protein: 7.2 g/dL (ref 6.1–8.1)

## 2018-11-03 LAB — PTH, INTACT AND CALCIUM
Calcium: 9.7 mg/dL (ref 8.6–10.3)
PTH: 17 pg/mL (ref 14–64)

## 2018-11-03 LAB — VITAMIN D 1,25 DIHYDROXY
Vitamin D 1, 25 (OH)2 Total: 25 pg/mL (ref 18–72)
Vitamin D2 1, 25 (OH)2: <8 pg/mL
Vitamin D3 1, 25 (OH)2: 25 pg/mL

## 2018-11-03 LAB — PHOSPHORUS, URINE, RANDOM: PHOSPHATE, RANDOM URINE: 126 mg/dL

## 2018-11-06 ENCOUNTER — Other Ambulatory Visit: Payer: Self-pay | Admitting: Internal Medicine

## 2018-11-06 ENCOUNTER — Other Ambulatory Visit: Payer: Self-pay

## 2018-11-06 ENCOUNTER — Other Ambulatory Visit: Payer: 59

## 2018-11-06 DIAGNOSIS — M816 Localized osteoporosis [Lequesne]: Secondary | ICD-10-CM

## 2018-11-07 LAB — FIBROBLAST GROWTH FACTOR 23: FGF-23 Fibroblast Growth Factor: 92 RU/mL

## 2018-11-10 LAB — SALIVARY CORTISOL X2, TIMED
Salivary Cortisol 2nd Specimen: 0.056 ug/dL
Salivary Cortisol Baseline: 0.037 ug/dL

## 2018-12-18 ENCOUNTER — Telehealth: Payer: Self-pay | Admitting: Family Medicine

## 2018-12-18 NOTE — Telephone Encounter (Signed)
error 

## 2018-12-18 NOTE — Telephone Encounter (Signed)
Patient has appointment tomorrow. Will fill medication then

## 2018-12-18 NOTE — Telephone Encounter (Signed)
Patient refill  lisinopril (ZESTRIL) 10 MG tablet [[id number]   [facility]

## 2018-12-19 ENCOUNTER — Encounter: Payer: Self-pay | Admitting: Family Medicine

## 2018-12-19 ENCOUNTER — Ambulatory Visit (INDEPENDENT_AMBULATORY_CARE_PROVIDER_SITE_OTHER): Payer: 59 | Admitting: Family Medicine

## 2018-12-19 ENCOUNTER — Other Ambulatory Visit: Payer: Self-pay

## 2018-12-19 VITALS — BP 129/78 | HR 55 | Temp 98.9°F | Wt 172.6 lb

## 2018-12-19 DIAGNOSIS — E559 Vitamin D deficiency, unspecified: Secondary | ICD-10-CM

## 2018-12-19 DIAGNOSIS — I10 Essential (primary) hypertension: Secondary | ICD-10-CM | POA: Diagnosis not present

## 2018-12-19 DIAGNOSIS — M839 Adult osteomalacia, unspecified: Secondary | ICD-10-CM

## 2018-12-19 DIAGNOSIS — E209 Hypoparathyroidism, unspecified: Secondary | ICD-10-CM | POA: Diagnosis not present

## 2018-12-19 MED ORDER — LISINOPRIL 10 MG PO TABS: 10.0000 mg | ORAL_TABLET | Freq: Every day | ORAL | 1 refills | Status: DC

## 2018-12-19 NOTE — Progress Notes (Signed)
   VIRTUAL VISIT VIA VIDEO  I connected with [name] on 12/19/18 at  1:30 PM EST by a video enabled telemedicine application and verified that I am speaking with the correct person using two identifiers. Location patient: Home Location provider: [facility], Office Persons participating in the virtual visit: Patient, Dr. Raoul Pitch and R.Baker, LPN  I discussed the limitations of evaluation and management by telemedicine and the availability of in person appointments. The patient expressed understanding and agreed to proceed.   SUBJECTIVE Chief Complaint  Patient presents with  . CMC    hypertension    HPI: Alejandro English is a 53 y.o. male present for follow-up on chronic conditions. Hypertension:  Pt reports  plants with lisinopril 10 mg.  Blood pressures ranges at home within normal limits. Patient denies chest pain, shortness of breath, dizziness or lower extremity edema.   Pt is taking a daily baby ASA and has questions on need.  Pt is not prescribed statin.  BMP: 04/10/2018 within normal limits CBC: 04/10/2018 within normal limits Lipid: 04/10/2018 total cholesterol 154, HDL 43, LDL 105, triglycerides 30 TSH: 04/10/2018 within normal limits Diet/exercise: Low-sodium, exercises routinely. RF: Hypertension  Patient Active Problem List   Diagnosis Date Noted  . Hypophosphatemia 09/13/2018  . Osteomalacia 09/13/2018  . Vitamin D insufficiency 09/13/2018  . Hypoparathyroidism ([place]) 07/25/2018  . Low phosphate levels 07/25/2018  . Vitamin D deficiency 04/11/2018  . Osteoporosis 02/01/2017  . Essential hypertension 12/02/2016    Social History   Tobacco Use  . Smoking status: Never Smoker  . Smokeless tobacco: Never Used  Substance Use Topics  . Alcohol use: Yes    Comment: has one drink per week    Current Outpatient Medications:  .  aspirin EC 81 MG tablet, Take 81 mg daily by mouth., Disp: , Rfl:  .  lisinopril (ZESTRIL) 10 MG tablet, Take 1 tablet (10 mg total) by  mouth daily., Disp: 90 tablet, Rfl: 1  Allergies  Allergen Reactions  . Penicillins Rash    OBJECTIVE: BP 129/78 (BP Location: Left Arm, Patient Position: Sitting, Cuff Size: Normal)   Pulse (!) 55   Temp 98.9 F (37.2 C) (Temporal)   Wt 172 lb 9.6 oz (78.3 kg)   BMI 24.77 kg/m  Gen: Afebrile. No acute distress.  HENT: AT. [place].  Eyes:Pupils Equal Round Reactive to light, Extraocular movements intact,  Conjunctiva without redness, discharge or icterus. Chest: No cough or shortness of breath on exam Neuro: . Alert. Oriented.  Psych: Normal affect, dress and demeanor. Normal speech. Normal thought content and judgment.  ASSESSMENT AND PLAN: [name] is a 54 y.o. male present for  Essential hypertension -  Stable. Refills  provided on lisinopril 10 mg QD.  - dicussed benefits vs risk with ASA 81 mg.  - diet and routine exercise recommended.  - labs UTD- typically has collected at [facility] yearly (he is hoping to go up March to June this year- pandemic dependent).  - Follow-up 6 months  Osteomalacia  following with endocrine for work up.  - C-telopeptide normal - dexa abnormal.  - neg celiac panel.  - lumbar xray normal.   - PTH low>> resolving.  PTH/vitamin D/calcium being monitored by endocrine. - no known reasoning for low vit d and osteoporosis on DEXA of lumbar spine and osteopenia of left femoral neck.    > 15 minutes spent with patient, > 50% of that time face to face   Howard Pouch, DO 12/19/2018

## 2019-03-08 ENCOUNTER — Other Ambulatory Visit: Payer: Self-pay

## 2019-03-12 ENCOUNTER — Ambulatory Visit: Payer: 59 | Admitting: Internal Medicine

## 2019-03-12 ENCOUNTER — Other Ambulatory Visit: Payer: Self-pay

## 2019-03-12 ENCOUNTER — Other Ambulatory Visit (INDEPENDENT_AMBULATORY_CARE_PROVIDER_SITE_OTHER): Payer: 59

## 2019-03-12 VITALS — BP 114/64 | HR 58 | Temp 97.8°F | Ht 70.0 in | Wt 179.6 lb

## 2019-03-12 DIAGNOSIS — M839 Adult osteomalacia, unspecified: Secondary | ICD-10-CM

## 2019-03-12 LAB — COMPREHENSIVE METABOLIC PANEL
ALT: 16 U/L (ref 0–53)
AST: 17 U/L (ref 0–37)
Albumin: 4.3 g/dL (ref 3.5–5.2)
Alkaline Phosphatase: 84 U/L (ref 39–117)
BUN: 21 mg/dL (ref 6–23)
CO2: 29 mEq/L (ref 19–32)
Calcium: 9.4 mg/dL (ref 8.4–10.5)
Chloride: 106 mEq/L (ref 96–112)
Creatinine, Ser: 1.12 mg/dL (ref 0.40–1.50)
GFR: 68.47 mL/min (ref >60.00)
Glucose, Bld: 88 mg/dL (ref 70–99)
Potassium: 4.2 mEq/L (ref 3.5–5.1)
Sodium: 141 mEq/L (ref 135–145)
Total Bilirubin: 0.3 mg/dL (ref 0.2–1.2)
Total Protein: 7.3 g/dL (ref 6.0–8.3)

## 2019-03-12 LAB — PHOSPHORUS: Phosphorus: 2.7 mg/dL (ref 2.3–4.6)

## 2019-03-12 LAB — VITAMIN D 25 HYDROXY (VIT D DEFICIENCY, FRACTURES): VITD: 26.21 ng/mL — ABNORMAL LOW (ref 30.00–100.00)

## 2019-03-12 LAB — MAGNESIUM: Magnesium: 1.9 mg/dL (ref 1.5–2.5)

## 2019-03-12 NOTE — Patient Instructions (Signed)
-   Please stop by the lab today  
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
## 2019-03-13 ENCOUNTER — Encounter: Payer: Self-pay | Admitting: Internal Medicine

## 2019-03-22 LAB — FIBROBLAST GROWTH FACTOR 23: FGF-23 Fibroblast Growth Factor: 99 RU/mL

## 2019-03-22 LAB — EXTRA SPECIMEN

## 2019-03-22 LAB — N-TELOPEPTIDE, CROSS-LINKED, SERUM: N-Telopeptide: 10 nmol{BCE} (ref 5.4–24.2)

## 2019-03-22 LAB — C-TERMINAL TELOPEPTIDE: C-Telopeptide (CTx): 198 pg/mL (ref 87–345)

## 2019-03-22 LAB — PARATHYROID HORMONE, INTACT (NO CA): PTH: 35 pg/mL (ref 14–64)

## 2019-03-26 ENCOUNTER — Encounter: Payer: Self-pay | Admitting: Internal Medicine

## 2019-06-14 ENCOUNTER — Encounter: Payer: Self-pay | Admitting: Family Medicine

## 2019-06-14 MED ORDER — LISINOPRIL 10 MG PO TABS: 10.0000 mg | ORAL_TABLET | Freq: Every day | ORAL | 0 refills | Status: DC

## 2019-07-03 ENCOUNTER — Other Ambulatory Visit: Payer: Self-pay

## 2019-07-03 ENCOUNTER — Encounter: Payer: Self-pay | Admitting: Family Medicine

## 2019-07-03 ENCOUNTER — Ambulatory Visit: Payer: 59 | Admitting: Family Medicine

## 2019-07-03 VITALS — BP 112/73 | HR 66 | Temp 98.2°F | Resp 18 | Ht 70.0 in | Wt 175.0 lb

## 2019-07-03 DIAGNOSIS — I10 Essential (primary) hypertension: Secondary | ICD-10-CM | POA: Diagnosis not present

## 2019-07-03 DIAGNOSIS — B351 Tinea unguium: Secondary | ICD-10-CM | POA: Diagnosis not present

## 2019-07-03 MED ORDER — LISINOPRIL 10 MG PO TABS: 10.0000 mg | ORAL_TABLET | Freq: Every day | ORAL | 1 refills | Status: DC

## 2019-07-03 MED ORDER — TERBINAFINE HCL 250 MG PO TABS: 250.0000 mg | ORAL_TABLET | Freq: Every day | ORAL | 2 refills | Status: DC

## 2019-07-03 NOTE — Patient Instructions (Signed)
Have lab appt in 4 weeks to monitor liver enzymes while on medication.  Make sure to use Lamisil spray on shoes and socks daily.  Terbinafine will be once a day for 12 weeks.    Athlete's Foot  Athlete's foot (tinea pedis) is a fungal infection of the skin on your feet. It often occurs on the skin that is between or underneath your toes. It can also occur on the soles of your feet. Symptoms include itchy or white and flaky areas on the skin. The infection can spread from person to person (is contagious). It can also spread when a person's bare feet come in contact with the fungus on shower floors or on items such as shoes. Follow these instructions at home: Medicines  Apply or take over-the-counter and prescription medicines only as told by your doctor.  Apply your antifungal medicine as told by your doctor. Do not stop using the medicine even if your feet start to get better. Foot care  Do not scratch your feet.  Keep your feet dry: ? Wear cotton or wool socks. Change your socks every day or if they become wet. ? Wear shoes that allow air to move around, such as sandals or canvas tennis shoes.  Wash and dry your feet: ? Every day or as told by your doctor. ? After exercising. ? Including the area between your toes. General instructions  Do not share any of these items that touch your feet: ? Towels. ? Shoes. ? Nail clippers. ? Other personal items.  Protect your feet by wearing sandals in wet areas, such as locker rooms and shared showers.  Keep all follow-up visits as told by your doctor. This is important.  If you have diabetes, keep your blood sugar under control. Contact a doctor if:  You have a fever.  You have swelling, pain, warmth, or redness in your foot.  Your feet are not getting better with treatment.  Your symptoms get worse.  You have new symptoms. Summary  Athlete's foot is a fungal infection of the skin on your feet.  Symptoms include itchy or  white and flaky areas on the skin.  Apply your antifungal medicine as told by your doctor.  Keep your feet clean and dry. This information is not intended to replace advice given to you by your health care provider. Make sure you discuss any questions you have with your health care provider. Document Revised: 01/13/2017 Document Reviewed: 11/08/2016 Elsevier Patient Education  2020 [employer].

## 2019-07-03 NOTE — Progress Notes (Signed)
SUBJECTIVE Chief Complaint  Patient presents with  . Hypertension    Pt checks BP at home random times.     HPI: Alejandro English is a 54 y.o. male present for follow-up on chronic conditions.  Hypertension:  Pt reports compliance with lisinopril 10 mg.  Blood pressures ranges at home within normal limits. Patient denies chest pain, shortness of breath, dizziness or lower extremity edema.   Pt is taking a daily baby ASA and has questions on need.  Pt is not prescribed statin.  BMP: 04/10/2018 within normal limits CBC: 04/10/2018 within normal limits Lipid: 04/10/2018 total cholesterol 154, HDL 43, LDL 105, triglycerides 30 TSH: 04/10/2018 within normal limits Diet/exercise: Low-sodium, exercises routinely. RF: Hypertension  Toenail fungus: Patient reports he has had a fungus in his toenails for quite some time.  He has tried over-the-counter topical solutions which do not seem to be improving his condition.  He endorses having toenail changes along all 5 toenails of his left foot and his right large toenail.  He does have skin changes between the toes and at the bottom of his foot.  Patient Active Problem List   Diagnosis Date Noted  . Hypophosphatemia 09/13/2018  . Osteomalacia 09/13/2018  . Vitamin D insufficiency 09/13/2018  . Hypoparathyroidism ([place]) 07/25/2018  . Essential hypertension 12/02/2016    Social History   Tobacco Use  . Smoking status: Never Smoker  . Smokeless tobacco: Never Used  Substance Use Topics  . Alcohol use: Yes    Comment: has one drink per week    Current Outpatient Medications:  .  aspirin EC 81 MG tablet, Take 81 mg daily by mouth., Disp: , Rfl:  .  lisinopril (ZESTRIL) 10 MG tablet, Take 1 tablet (10 mg total) by mouth daily., Disp: 30 tablet, Rfl: 0 .  Vitamin D, Cholecalciferol, 25 MCG (1000 UT) CAPS, Take by mouth., Disp: , Rfl:   Allergies  Allergen Reactions  . Penicillins Rash    OBJECTIVE: BP 112/73 (BP Location: Left Arm, Patient  Position: Sitting, Cuff Size: Normal)   Pulse 66   Temp 98.2 F (36.8 C) (Temporal)   Resp 18   Ht 5\' 10"  (1.778 m)   Wt 175 lb (79.4 kg)   SpO2 100%   BMI 25.11 kg/m  Gen: Afebrile. No acute distress.  HENT: AT. [place].  Eyes:Pupils Equal Round Reactive to light, Extraocular movements intact,  Conjunctiva without redness, discharge or icterus. CV: RRR, no edema, +2/4 P posterior tibialis pulses Chest: CTAB, no wheeze or crackles Skin: x 5 nail deformities left foot and x1 right large toenail deformity.  Scaly skin lesions/blisters between toes on left foot and bottom of foot.  No purpura or petechiae.  Neuro: Normal gait. PERLA. EOMi. Alert. Oriented x3  Psych: Normal affect, dress and demeanor. Normal speech. Normal thought content and judgment.   ASSESSMENT AND PLAN: Alejandro English is a 54 y.o. male present for  Essential hypertension - Stable.  - continue  lisinopril 10 mg QD.  - dicussed benefits vs risk with ASA 81 mg.  - diet and routine exercise recommended.  - labs UTD- typically has collected at [facility] yearly > he has it scheduled for July - Follow-up 6 months  Osteomalacia  following with endocrine   Onychomycosis Discussed treatment with him today.  He has bilateral toenail deformities and changes secondary to fungus.  He also has skin changes between toes and on the bottom of his foot secondary to fungal infection. Terbinafine 250 daily  x12 weeks.  LFTs every 1 month. Lamisil spray to be used daily in shoes until infection is completely resolved.   Orders Placed This Encounter  Procedures  . Hepatic function panel   Meds ordered this encounter  Medications  . lisinopril (ZESTRIL) 10 MG tablet    Sig: Take 1 tablet (10 mg total) by mouth daily.    Dispense:  90 tablet    Refill:  1  . terbinafine (LAMISIL) 250 MG tablet    Sig: Take 1 tablet (250 mg total) by mouth daily.    Dispense:  30 tablet    Refill:  2   Referral Orders  No referral(s) requested  today    Felix Pacini, DO 07/03/2019

## 2019-07-15 ENCOUNTER — Encounter: Payer: Self-pay | Admitting: Internal Medicine

## 2019-07-15 DIAGNOSIS — M839 Adult osteomalacia, unspecified: Secondary | ICD-10-CM

## 2019-08-01 ENCOUNTER — Ambulatory Visit (INDEPENDENT_AMBULATORY_CARE_PROVIDER_SITE_OTHER): Payer: 59 | Admitting: Family Medicine

## 2019-08-01 ENCOUNTER — Other Ambulatory Visit: Payer: Self-pay

## 2019-08-01 DIAGNOSIS — M839 Adult osteomalacia, unspecified: Secondary | ICD-10-CM

## 2019-08-01 DIAGNOSIS — B351 Tinea unguium: Secondary | ICD-10-CM

## 2019-08-01 DIAGNOSIS — I10 Essential (primary) hypertension: Secondary | ICD-10-CM

## 2019-08-01 LAB — HEPATIC FUNCTION PANEL
ALT: 18 U/L (ref 0–53)
AST: 17 U/L (ref 0–37)
Albumin: 4.4 g/dL (ref 3.5–5.2)
Alkaline Phosphatase: 67 U/L (ref 39–117)
Bilirubin, Direct: 0.1 mg/dL (ref 0.0–0.3)
Total Bilirubin: 0.6 mg/dL (ref 0.2–1.2)
Total Protein: 6.8 g/dL (ref 6.0–8.3)

## 2019-08-01 LAB — BASIC METABOLIC PANEL
BUN: 18 mg/dL (ref 6–23)
CO2: 29 mEq/L (ref 19–32)
Calcium: 9.5 mg/dL (ref 8.4–10.5)
Chloride: 105 mEq/L (ref 96–112)
Creatinine, Ser: 1.06 mg/dL (ref 0.40–1.50)
GFR: 72.86 mL/min (ref >60.00)
Glucose, Bld: 90 mg/dL (ref 70–99)
Potassium: 4.6 mEq/L (ref 3.5–5.1)
Sodium: 140 mEq/L (ref 135–145)

## 2019-08-01 LAB — PHOSPHORUS: Phosphorus: 1.9 mg/dL — ABNORMAL LOW (ref 2.3–4.6)

## 2019-08-01 LAB — MAGNESIUM: Magnesium: 2 mg/dL (ref 1.5–2.5)

## 2019-08-01 LAB — VITAMIN D 25 HYDROXY (VIT D DEFICIENCY, FRACTURES): VITD: 30.56 ng/mL (ref 30.00–100.00)

## 2019-08-02 LAB — PARATHYROID HORMONE, INTACT (NO CA): PTH: 20 pg/mL (ref 14–64)

## 2019-08-02 NOTE — Addendum Note (Signed)
Addended by: Daryel November L on: 08/02/2019 02:24 PM   Modules accepted: Orders

## 2019-08-03 ENCOUNTER — Other Ambulatory Visit: Payer: Self-pay | Admitting: Internal Medicine

## 2019-08-24 ENCOUNTER — Other Ambulatory Visit: Payer: Self-pay

## 2019-08-24 ENCOUNTER — Ambulatory Visit (INDEPENDENT_AMBULATORY_CARE_PROVIDER_SITE_OTHER): Payer: 59 | Admitting: Family Medicine

## 2019-08-24 DIAGNOSIS — B351 Tinea unguium: Secondary | ICD-10-CM

## 2019-08-24 DIAGNOSIS — M839 Adult osteomalacia, unspecified: Secondary | ICD-10-CM

## 2019-08-24 LAB — HEPATIC FUNCTION PANEL
ALT: 20 U/L (ref 0–53)
AST: 19 U/L (ref 0–37)
Albumin: 4.4 g/dL (ref 3.5–5.2)
Alkaline Phosphatase: 77 U/L (ref 39–117)
Bilirubin, Direct: 0.1 mg/dL (ref 0.0–0.3)
Total Bilirubin: 0.6 mg/dL (ref 0.2–1.2)
Total Protein: 7.2 g/dL (ref 6.0–8.3)

## 2019-08-28 ENCOUNTER — Ambulatory Visit: Payer: 59 | Admitting: Internal Medicine

## 2019-08-29 ENCOUNTER — Encounter: Payer: Self-pay | Admitting: Family Medicine
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
## 2019-08-31 ENCOUNTER — Ambulatory Visit: Payer: 59 | Admitting: Internal Medicine

## 2019-08-31 ENCOUNTER — Encounter: Payer: Self-pay | Admitting: Internal Medicine

## 2019-08-31 ENCOUNTER — Other Ambulatory Visit: Payer: Self-pay

## 2019-08-31 VITALS — BP 118/62 | HR 58 | Ht 70.0 in | Wt 177.4 lb

## 2019-08-31 DIAGNOSIS — M816 Localized osteoporosis [Lequesne]: Secondary | ICD-10-CM | POA: Insufficient documentation

## 2019-08-31 DIAGNOSIS — E559 Vitamin D deficiency, unspecified: Secondary | ICD-10-CM

## 2019-08-31 NOTE — Patient Instructions (Signed)
-   Increase Vitamin D 2000 iu daily

## 2019-09-11 ENCOUNTER — Telehealth: Payer: Self-pay | Admitting: Internal Medicine

## 2019-09-11 NOTE — Telephone Encounter (Signed)
Patient called to advise that he has scheduled his scan 09/18/19 @ 9am  - Patient was advised by [facility] that this was only covered by his insurance every 2 years unless there was a new diagnosis, change in diagnosis, or additional conditions etc.  Patient would like clarification of DX and reason for scan in order to hopefully have insurance cover the scan.  Please review and advise patient at phone# [phone]

## 2019-09-14 NOTE — Telephone Encounter (Signed)
I do not schedule DEXA-this would need to go to whoever is covering for [employer]

## 2019-09-14 NOTE — Telephone Encounter (Signed)
Dr. Lonzo Cloud, pt would like to know the reason for having another bone density scan less than two years apart. Could you please advise?

## 2019-09-17 ENCOUNTER — Encounter: Payer: Self-pay | Admitting: Internal Medicine

## 2019-09-17 NOTE — Telephone Encounter (Signed)
Called pt and left pt detailed voicemail with MD message. Encouraged pt to schedule bone density scan and contact this office again if he should have any questions or concerns.

## 2019-09-18 ENCOUNTER — Telehealth: Payer: Self-pay

## 2019-09-18 ENCOUNTER — Encounter: Payer: Self-pay | Admitting: Family Medicine

## 2019-09-18 ENCOUNTER — Inpatient Hospital Stay: Admission: RE | Admit: 2019-09-18 | Payer: 59 | Source: Ambulatory Visit

## 2019-09-18 NOTE — Telephone Encounter (Signed)
Called pt's insurance and spoke with rep named Iris. Call reference # was [id number]. Called to verify whether bone density scan would be covered since pt had one last year, and typical time frame is every 2 years.  Iris indicated that the service would be covered because it would be considered medical neccessity, whereas the one that is covered every two years is coded as preventative. These two separate ways of coding follow different guidelines.  The patient would be required to cover the rest of his deductible. Pt's full deductible is $750.00. He has met $180.13. Pt has been notified via mychart of this information.

## 2019-09-21 NOTE — Telephone Encounter (Signed)
Called pt and left detailed voicemail with MD message. 

## 2019-09-28 ENCOUNTER — Other Ambulatory Visit: Payer: Self-pay

## 2019-09-28 ENCOUNTER — Ambulatory Visit (INDEPENDENT_AMBULATORY_CARE_PROVIDER_SITE_OTHER)
Admission: RE | Admit: 2019-09-28 | Discharge: 2019-09-28 | Disposition: A | Discharge status: Home / Self Care | Payer: 59 | Source: Ambulatory Visit | Attending: Internal Medicine | Admitting: Internal Medicine

## 2019-09-28 DIAGNOSIS — M816 Localized osteoporosis [Lequesne]: Secondary | ICD-10-CM | POA: Diagnosis not present

## 2019-09-30 ENCOUNTER — Encounter: Payer: Self-pay | Admitting: Internal Medicine

## 2019-10-01 ENCOUNTER — Other Ambulatory Visit: Payer: Self-pay | Admitting: Internal Medicine

## 2019-10-01 ENCOUNTER — Telehealth: Payer: Self-pay | Admitting: Internal Medicine

## 2019-10-01 DIAGNOSIS — M810 Age-related osteoporosis without current pathological fracture: Secondary | ICD-10-CM

## 2019-10-01 NOTE — Telephone Encounter (Signed)
[name],    I put a referral for Mr. Alejandro English for Endocrinology . Please send him to [facility]..   Please fax my last note and ALL labs including urine.     Thanks

## 2019-10-01 NOTE — Telephone Encounter (Signed)
Patient has not had labs done should I call to have him come in to have this done first-please advise

## 2019-10-02 NOTE — Telephone Encounter (Signed)
Referral has been faxed to [facility]-FYI

## 2019-10-14 ENCOUNTER — Encounter: Payer: Self-pay | Admitting: Family Medicine

## 2019-11-12 LAB — HM DEXA SCAN: HM Dexa Scan: ABNORMAL

## 2019-11-12 LAB — BASIC METABOLIC PANEL: Creatinine: 1.1 (ref 0.6–1.3)

## 2019-11-12 LAB — COMPREHENSIVE METABOLIC PANEL: Calcium: 9.5 (ref 8.7–10.7)

## 2019-11-12 LAB — VITAMIN D 25 HYDROXY (VIT D DEFICIENCY, FRACTURES): Vit D, 25-Hydroxy: 30.5

## 2019-11-21 ENCOUNTER — Telehealth: Payer: Self-pay

## 2019-11-21 NOTE — Telephone Encounter (Signed)
Received bone density results from [facility]. Placed on PCP desk for review

## 2019-11-21 NOTE — Telephone Encounter (Signed)
Fyi: Placed in scan basket- these were records from his specialist. This was not our order or a result for a bone density.

## 2019-11-26 ENCOUNTER — Encounter: Payer: Self-pay | Admitting: Family Medicine

## 2019-12-17 DIAGNOSIS — D72819 Decreased white blood cell count, unspecified: Secondary | ICD-10-CM | POA: Insufficient documentation

## 2019-12-17 DIAGNOSIS — F439 Reaction to severe stress, unspecified: Secondary | ICD-10-CM | POA: Insufficient documentation

## 2019-12-17 HISTORY — DX: Reaction to severe stress, unspecified: F43.9

## 2020-01-03 ENCOUNTER — Telehealth: Payer: Self-pay

## 2020-01-03 NOTE — Telephone Encounter (Signed)
Attempted to reach patient again to follow up on symptoms/recommendations. LM to CB, would advise to be evaluated at nearest ED if symptoms still occurring. 

## 2020-01-03 NOTE — Telephone Encounter (Signed)
Patient was last seen by this provider 6 months ago for his chronic conditions.  Patient had his recent comprehensive physical completed at a different facility.  Per his history review he has had a syncopal event in the past and had a normal EEG at that time.  There is not much I can offer and aware of advice given he has not been evaluated, other than if he is not returning to baseline or he has another event he should be seen in the emergency room immediately.  I would discourage him from driving until he is completely back to baseline.  If he is not improving we can work him into the schedule sooner, if there is an opening or cancellation.

## 2020-01-03 NOTE — Telephone Encounter (Signed)
Attempted to reach patient again to follow up on symptoms/recommendations. LM to CB, would advise to be evaluated at nearest ED if symptoms still occurring.

## 2020-01-03 NOTE — Telephone Encounter (Signed)
Spoke with patient and advised of recommendations. He took his BP without the lisinopril and it was 130/64. He will touch base with [place] tomorrow if still not improving

## 2020-01-03 NOTE — Telephone Encounter (Signed)
LM to f/u with patient. Advised to proceed to ED by triage nurse  Patient Name: Alejandro English Gender: Male DOB: 1965/07/26 Age: 54 Y 2 M 27 D Return Phone Number: [phone] (Primary), [phone] (Secondary) Address[place] [address] Client [facility] - Client Client Site [facility] - Day Physician [place], [place] Contact Type Call Who Is Calling Patient / Member / Family / Caregiver Call Type Triage / Clinical Caller Name Victorino Dike Relationship To Patient Spouse Return Phone Number [phone] (Primary) Chief Complaint LOW BODY TEMP < 97 Reason for Call Symptomatic / Request for Health Information Initial Comment PT passed out earlier this morning, now feeling weak, nauseated, cold, temp of 95.0. Translation No Nurse Assessment Nurse: Henri Medal, RN, Amy Date/Time (Eastern Time): 01/03/2020 8:15:42 AM Confirm and document reason for call. If symptomatic, describe symptoms. ---Caller states her husband passed out earlier this morning. It lasted a few minutes. She was giving him a hair cut. He said he wasn't feeling well & 30 seconds later he went limp & was not responding. His head went down. He was starting to slide off the stool. He hit his back a little bit on the stool, but he did not hit his head. Then all of a sudden he was shaking & his legs stiffened up. His face was red & his feet were red. He woke up suddenly & he did know where he was. He now if feeling weak, nauseated, cold, & has a temp of 95.0 orally. (She did just give him OJ to drink) He is alert & awake now, lying down. Does the patient have any new or worsening symptoms? ---Yes Will a triage be completed? ---Yes Related visit to physician within the last 2 weeks? ---No Does the PT have any chronic conditions? (i.e. diabetes, asthma, this includes High risk factors for pregnancy, etc.) ---Yes List chronic conditions. ---HTN Is this a behavioral  health or substance abuse call? ---No Guidelines Guideline Title Affirmed Question Affirmed Notes Nurse Date/Time (Eastern Time) Fainting [1] Fainted > 15 minutes ago AND [2] still looks pale (pale skin, pallor) Lovelace, RN, Amy 01/03/2020 8:16:48 AM PLEASE NOTE: All timestamps contained within this report are represented as [place] Standard Time. CONFIDENTIALTY NOTICE: This fax transmission is intended only for the addressee. It contains information that is legally privileged, confidential or otherwise protected from use or disclosure. If you are not the intended recipient, you are strictly prohibited from reviewing, disclosing, copying using or disseminating any of this information or taking any action in reliance on or regarding this information. If you have received this fax in error, please notify us immediately by telephone so that we can arrange for its return to [place]. Phone: [phone], Toll-Free: [phone], Fax: [phone] Page: 2 of 2 Call Id: [id number] Disp. Time [name] Time) Disposition Final User 01/03/2020 8:14:43 AM Send to Urgent Queue Jory Ee 01/03/2020 8:25:27 AM Go to ED Now Yes Lovelace, RN, Amy Caller Disagree/Comply Comply Caller Understands Yes PreDisposition InappropriateToAsk Care Advice Given Per Guideline NOTE TO TRIAGER - DRIVING: * Another adult should drive. BRING MEDICINES: * Bring a list of your current medicines when you go to the Emergency Department (ER). * Bring the pill bottles too. This will help the doctor (or NP/PA) to make certain you are taking the right medicines and the right dose. GO TO ED NOW: * You need to be seen in the Emergency Department. * Go to the ED at ___________ Hospital. * Leave now.  Drive carefully. CARE ADVICE given per Fainting (Adult) guideline. CALL EMS 911 IF: * Too dizzy or weak to stand * Difficulty breathing or confusion occurs * Faints again * You become worse Comments User: Lutricia Horsfall, RN Date/Time  (Eastern Time): 01/03/2020 8:26:37 AM Caller is new to the area & does not know what hospital they will go to. Recommended Googling the nearest hospital & taking him there. Referrals GO TO FACILITY UNDECIDED

## 2020-01-03 NOTE — Telephone Encounter (Signed)
"  I am following up from a phone call from Dr. Alan Ripper office. Alejandro English still doesn't feel 100% himself, but is up and moving around. His temperature did go up to 96.4, and sweating has stopped. He had some soup for lunch and so far he has kept that down. He did not take his Lisinopril medication this morning in fear of reducing his blood pressure to low. He recently has had a comprehensive physical and blood work done and nothing alarming showed up, which is why he decided not to go to the emergency room. Alejandro English's direct number is [phone]"  Patient's next appointment is not until 12/7. Refer to triage note below for more details. Please advise if sooner appointment is needed/further recommendations.

## 2020-01-08 ENCOUNTER — Ambulatory Visit: Payer: 59 | Admitting: Family Medicine

## 2020-01-08 ENCOUNTER — Encounter: Payer: Self-pay | Admitting: Family Medicine

## 2020-01-08 ENCOUNTER — Other Ambulatory Visit: Payer: Self-pay

## 2020-01-08 VITALS — BP 105/70 | HR 61 | Temp 98.6°F | Ht 69.5 in | Wt 174.0 lb

## 2020-01-08 DIAGNOSIS — I10 Essential (primary) hypertension: Secondary | ICD-10-CM | POA: Diagnosis not present

## 2020-01-08 DIAGNOSIS — E559 Vitamin D deficiency, unspecified: Secondary | ICD-10-CM

## 2020-01-08 DIAGNOSIS — M810 Age-related osteoporosis without current pathological fracture: Secondary | ICD-10-CM | POA: Diagnosis not present

## 2020-01-08 MED ORDER — LISINOPRIL 5 MG PO TABS: 5.0000 mg | ORAL_TABLET | Freq: Every day | ORAL | 1 refills | Status: DC

## 2020-01-08 NOTE — Progress Notes (Signed)
SUBJECTIVE Chief Complaint  Patient presents with  . Follow-up    [facility];     HPI: Alejandro English is a 54 y.o. male present for follow-up on chronic conditions.  Hypertension:  Pt reports compliance  with lisinopril 10 mg.  Blood pressures ranges at home within normal limits- but he has had some low normal pressures with dizziness on rare occassions. Patient denies chest pain, shortness of breath,  or lower extremity edema. He had one syncopal episode a few weeks ago when his wife was cutting his hair. He states he use to have syncopal episodes as a child. He has not taken his medication that day.  He just had a CPE at [facility] and all labs were normal. He reports he had not eaten yet that day.   Pt is taking a daily baby ASA and has questions on need.  Pt is not prescribed statin.  Diet/exercise: Low-sodium, exercises routinely. RF: Hypertension    Patient Active Problem List   Diagnosis Date Noted  . Leukopenia 12/17/2019  . Situational stress 12/17/2019  . Localized osteoporosis without current pathological fracture 08/31/2019  . Onychomycosis 07/03/2019  . Hypophosphatemia 09/13/2018  . Osteomalacia 09/13/2018  . Vitamin D insufficiency 09/13/2018  . Stone in kidney 01/01/2017  . Essential hypertension 12/02/2016    Social History   Tobacco Use  . Smoking status: Never Smoker  . Smokeless tobacco: Never Used  Substance Use Topics  . Alcohol use: Yes    Comment: has one drink per week    Current Outpatient Medications:  .  aspirin EC 81 MG tablet, Take 81 mg daily by mouth., Disp: , Rfl:  .  Cholecalciferol (VITAMIN D3) 50 MCG (2000 UT) CAPS, Take by mouth. , Disp: , Rfl:  .  lisinopril (ZESTRIL) 10 MG tablet, Take 1 tablet (10 mg total) by mouth daily., Disp: 90 tablet, Rfl: 1 .  [START ON 01/15/2020] zoledronic acid (RECLAST) 5 MG/100ML SOLN injection, Inject into the vein. (Patient not taking: Reported on 01/08/2020), Disp: , Rfl:   Allergies  Allergen  Reactions  . Penicillins Rash    OBJECTIVE: BP 105/70   Pulse 61   Temp 98.6 F (37 C) (Oral)   Ht 5' 9.5" (1.765 m)   Wt 174 lb (78.9 kg)   SpO2 98%   BMI 25.33 kg/m  Gen: Afebrile. No acute distress. Nontoxic pleasant male.  HENT: AT. [place].  Eyes:Pupils Equal Round Reactive to light, Extraocular movements intact,  Conjunctiva without redness, discharge or icterus. Neck/lymp/endocrine: Supple,no lymphadenopathy CV: RRR no murmur, no edema, +2/4 P posterior tibialis pulses Chest: CTAB, no wheeze or crackles Abd: Soft. NTND. BS present. no Masses palpated.  Skin: no rashes, purpura or petechiae.  Neuro:  Normal gait. PERLA. EOMi. Alert. Oriented x3  Psych: Normal affect, dress and demeanor. Normal speech. Normal thought content and judgment.    ASSESSMENT AND PLAN: Alejandro English is a 54 y.o. male present for  Essential hypertension - have low end pressures with occasional dizziness. After Review of his home pressures believe he would benefit from lowering lisinopril to 5 mg QD.  - decrease  lisinopril 10 mg> 5 mg QD.  - dicussed benefits vs risk with ASA 81 mg.  - diet and routine exercise recommended.  - labs [facility] - Follow-up 5.5 months  osteoporosis  following with endocrine at [facility] and nephrologist. Has had low phosphorus on a few occassions and they would like nephrology to consult prior to start of reclast.  No orders of the defined types were placed in this encounter.  No orders of the defined types were placed in this encounter.  Referral Orders  No referral(s) requested today    Felix Pacini, DO 01/08/2020

## 2020-01-08 NOTE — Patient Instructions (Addendum)
Decrease lisinopril to 5 mg a day.  I hope your carotid studies go well. Please make sure we get a copy of these.    Next appt 5.5 mos.   Hope you have a great Holiday Season!

## 2020-01-14 IMAGING — CR LUMBAR SPINE - COMPLETE 4+ VIEW
5 series · 5 of 5 positions shown · non-contrast
Comparison: None.

CLINICAL DATA: Abnormal bone density. Osteomalacia. Extremely low
T-score.

EXAM:
LUMBAR SPINE - COMPLETE 4+ VIEW

[t l-spine a.p.]
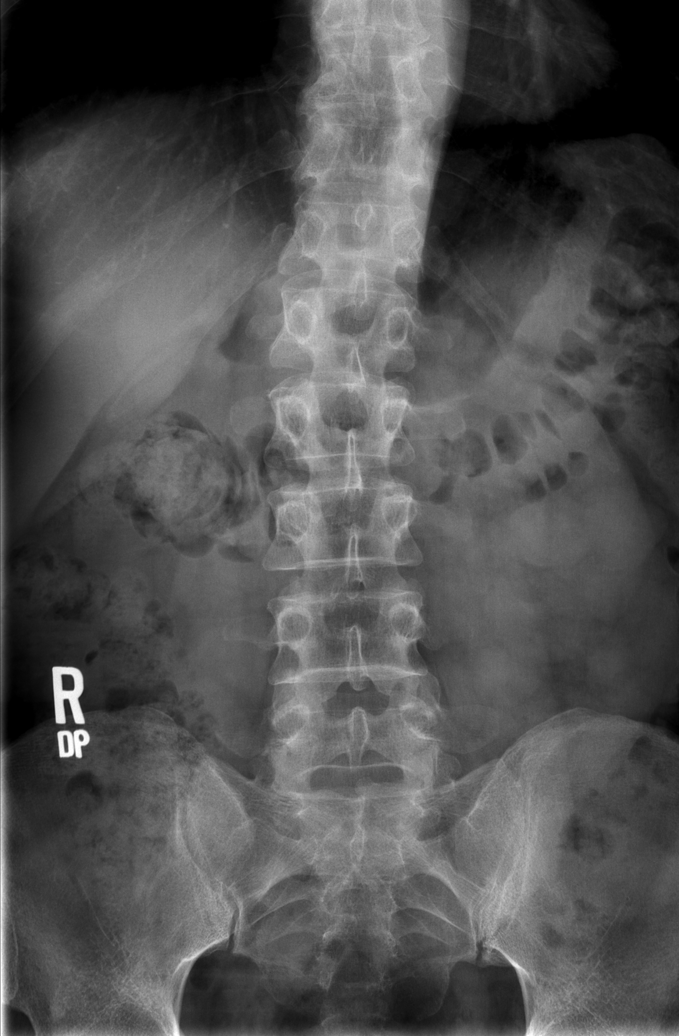

[t l-spine oblique exposure (1 of 2)]
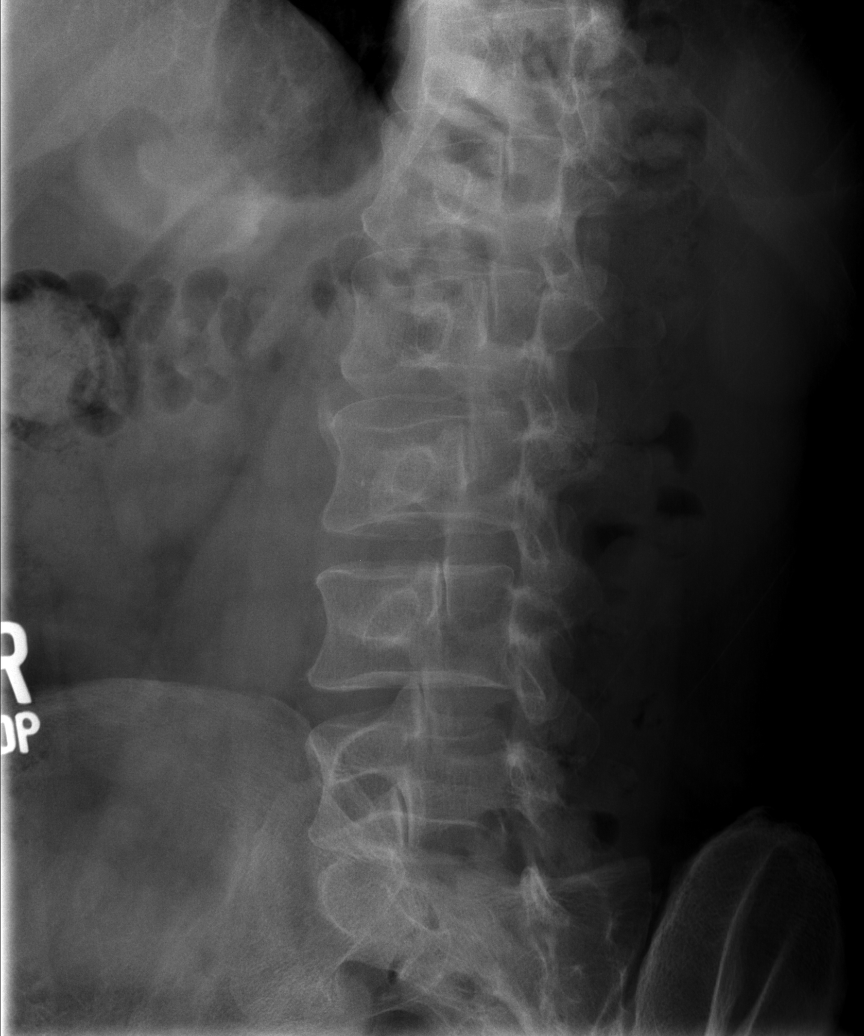

[t l-spine oblique exposure (2 of 2)]
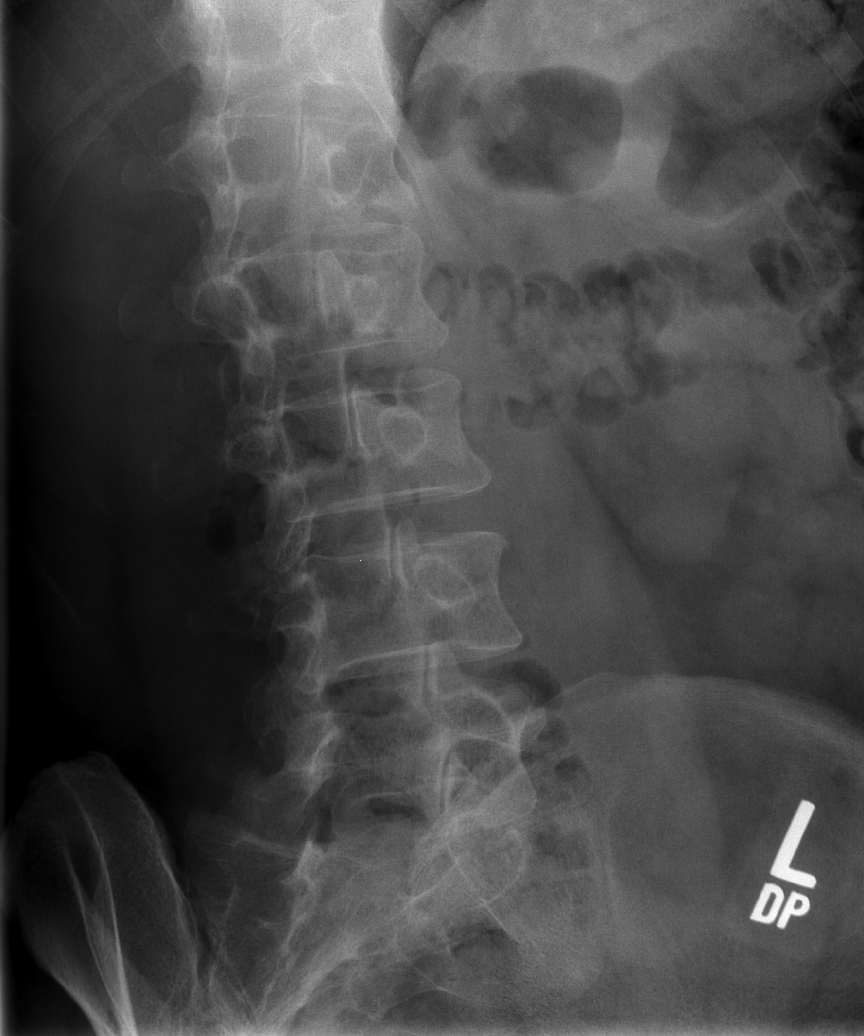

[t l-spine lat]
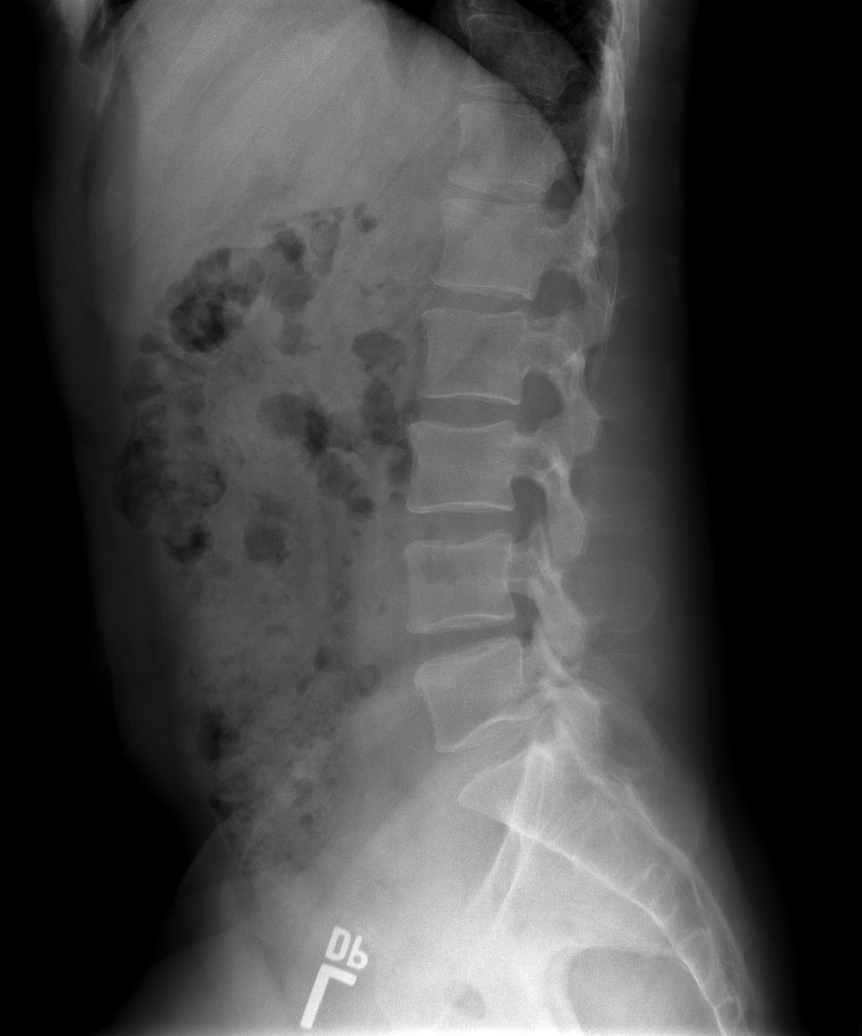

[t l-spine l5-s1 spot]
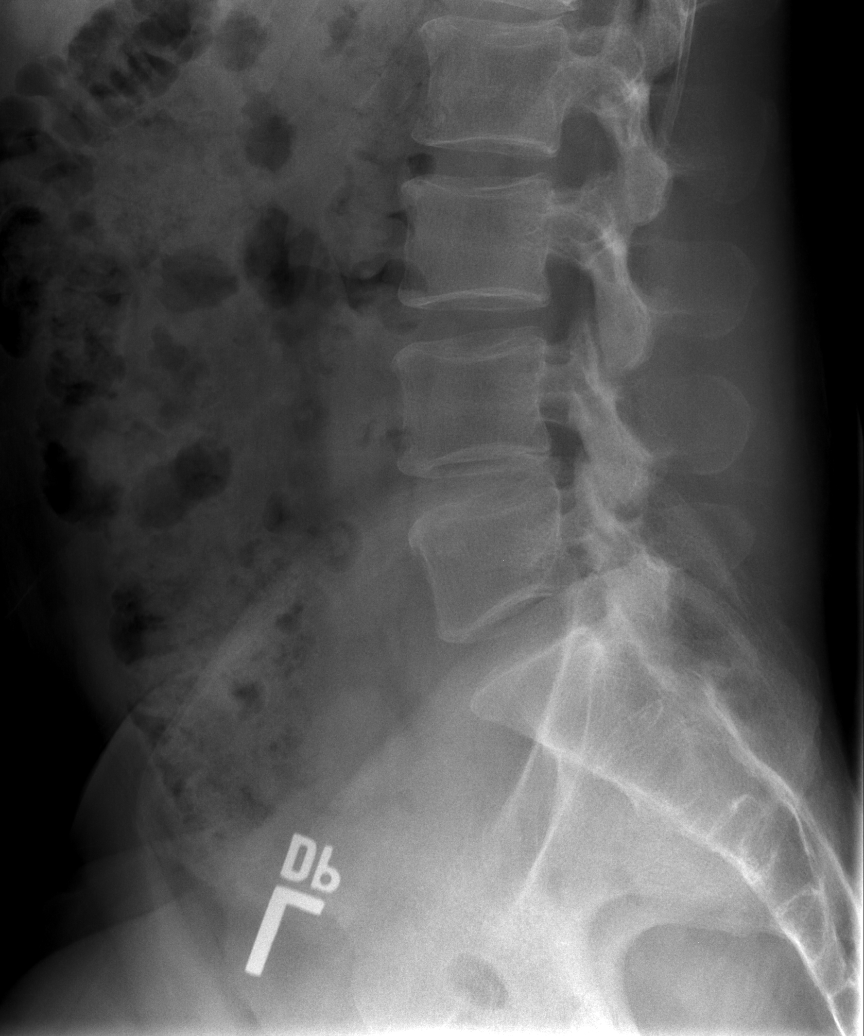

[5 of 5 positions shown; findings below may reference images not displayed]

FINDINGS: There is no evidence of lumbar spine fracture. Alignment is normal.
Intervertebral disc spaces are maintained.
IMPRESSION: Negative.

## 2020-01-17 ENCOUNTER — Other Ambulatory Visit: Payer: Self-pay | Admitting: Family Medicine

## 2020-01-21 ENCOUNTER — Encounter: Payer: Self-pay | Admitting: Family Medicine

## 2020-01-21 ENCOUNTER — Ambulatory Visit: Payer: 59 | Admitting: Family Medicine

## 2020-01-21 ENCOUNTER — Other Ambulatory Visit: Payer: Self-pay

## 2020-01-21 VITALS — BP 115/68 | HR 61 | Temp 98.5°F | Ht 69.5 in | Wt 176.0 lb

## 2020-01-21 DIAGNOSIS — H6983 Other specified disorders of Eustachian tube, bilateral: Secondary | ICD-10-CM | POA: Diagnosis not present

## 2020-01-21 DIAGNOSIS — H9313 Tinnitus, bilateral: Secondary | ICD-10-CM

## 2020-01-21 MED ORDER — FLUTICASONE PROPIONATE 50 MCG/ACT NA SUSP: 2.0000 | Freq: Every day | NASAL | 6 refills | Status: DC

## 2020-01-21 MED ORDER — AZITHROMYCIN 250 MG PO TABS: ORAL_TABLET | ORAL | 0 refills | Status: DC

## 2020-01-21 NOTE — Progress Notes (Signed)
This visit occurred during the SARS-CoV-2 public health emergency.  Safety protocols were in place, including screening questions prior to the visit, additional usage of staff PPE, and extensive cleaning of exam room while observing appropriate contact time as indicated for disinfecting solutions.    Alejandro English , 01-05-1966, 54 y.o., male MRN: [id number] Patient Care Team    Relationship Specialty Notifications Start End  Natalia Leatherwood, DO PCP - General Family Medicine  12/02/16     Chief Complaint  Patient presents with  . Tinnitus    Pt c/o ringing in ears b/l x 4 days; pt states the ringing happens intermittently      Subjective: Pt presents for an OV with complaints of ringing in his ears of 4 days duration- intermittent and bilateral. He reports both of his ears started to ring on Thursday and Saturday  Night they stopped. Then they restarted again the day after and have continued. He does admit to mild frontal sinus symptoms starting and nasal congestion. He denies fever, chills, nausea or vomit. He denies asa or nsaid use.  He also has a significant medical history of osteoporosis.   Depression screen [facility] 2/9 01/08/2020 06/06/2017 12/02/2016  Decreased Interest 0 0 0  Down, Depressed, Hopeless 0 0 0  PHQ - 2 Score 0 0 0    Allergies  Allergen Reactions  . Penicillins Rash   Social History   Social History Narrative   Single. Has 2 children. Works as a Gaffer.   MBA degree.   Wears a bicycle helmet. Wears his seatbelt.   Exercises routinely.   Past Medical History:  Diagnosis Date  . Allergy   . Anal fissure 2005  . Chickenpox   . H/O syncope   . History of EKG 01/20/2017   normal  . History of UTI   . Hypertension 12/2016  . Internal hemorrhoids    follow by  Dr. Rhea Belton  . Kidney stone 2018   Incidental finding on left kidney ultrasound 2018.  . Osteoporosis 2019   dexa  . Vitamin D deficiency 2018   23   Past Surgical History:  Procedure  Laterality Date  . ANAL FISTULOTOMY  2006  . COLONOSCOPY  07/02/2016   Normal, 10 year follow-up. Dr. Rhea Belton   Family History  Problem Relation Age of Onset  . Prostate cancer Father   . Diverticulitis Father        part of colon removed  . Hypertension Father   . Hypertension Brother   . Other Maternal Grandmother        died at 60  . Brain cancer Maternal Grandfather   . Diverticulitis Paternal Grandmother   . Diabetes Mellitus I Daughter   . Colon cancer Neg Hx   . Stomach cancer Neg Hx   . Rectal cancer Neg Hx   . Esophageal cancer Neg Hx   . Liver cancer Neg Hx   . Colon polyps Neg Hx    Allergies as of 01/21/2020      Reactions   Penicillins Rash      Medication List       Accurate as of January 21, 2020  2:21 PM. If you have any questions, ask your nurse or doctor.        STOP taking these medications   aspirin EC 81 MG tablet Stopped by: Felix Pacini, DO     TAKE these medications   azithromycin 250 MG tablet Commonly known as: ZITHROMAX 500  mg day 1, 250 mg x4 days Started by: Felix Pacini, DO   fluticasone 50 MCG/ACT nasal spray Commonly known as: FLONASE Place 2 sprays into both nostrils daily. Started by: Felix Pacini, DO   lisinopril 5 MG tablet Commonly known as: ZESTRIL Take 1 tablet (5 mg total) by mouth daily.   vitamin D3 50 MCG (2000 UT) Caps Take by mouth.   zoledronic acid 5 MG/100ML Soln injection Commonly known as: RECLAST Inject into the vein.       All past medical history, surgical history, allergies, family history, immunizations andmedications were updated in the EMR today and reviewed under the history and medication portions of their EMR.     ROS: Negative, with the exception of above mentioned in HPI   Objective:  BP 115/68   Pulse 61   Temp 98.5 F (36.9 C) (Oral)   Ht 5' 9.5" (1.765 m)   Wt 176 lb (79.8 kg)   SpO2 97%   BMI 25.62 kg/m  Body mass index is 25.62 kg/m. Gen: Afebrile. No acute distress.  Nontoxic in appearance, well developed, well nourished.  HENT: AT. [place]. Bilateral TM visualized with mild bilateral effusions present, no erythema. MMM, no oral lesions. Bilateral nares mild erythema and drainage left nare.. Throat without erythema or exudates. No cough or hoarseness.  Eyes:Pupils Equal Round Reactive to light, Extraocular movements intact,  Conjunctiva without redness, discharge or icterus. Neck/lymp/endocrine: Supple,no lymphadenopathy  No exam data present No results found. No results found for this or any previous visit (from the past 24 hour(s)).  Assessment/Plan: Alejandro English is a 54 y.o. male present for OV for  Eustachian tube dysfunction, bilateral/Tinnitus of both ears Bilateral ear effusions today- possibly allergies,early sinus infection vs eustachian tube dysfx.  Start Flonase nasal spray BID.  +/- OTC oral antihistamine.  z-pack printed in the event sinus sx start to point to infection over the holiday weekend. He will only use if symptoms worsen. (z-pack worked for him in the past) F/u PRN     Reviewed expectations re: course of current medical issues.  Discussed self-management of symptoms.  Outlined signs and symptoms indicating need for more acute intervention.  Patient verbalized understanding and all questions were answered.  Patient received an After-Visit Summary.    No orders of the defined types were placed in this encounter.  Meds ordered this encounter  Medications  . fluticasone (FLONASE) 50 MCG/ACT nasal spray    Sig: Place 2 sprays into both nostrils daily.    Dispense:  16 g    Refill:  6  . azithromycin (ZITHROMAX) 250 MG tablet    Sig: 500 mg day 1, 250 mg x4 days    Dispense:  6 tablet    Refill:  0   Referral Orders  No referral(s) requested today     Note is dictated utilizing voice recognition software. Although note has been proof read prior to signing, occasional typographical errors still can be missed. If any  questions arise, please do not hesitate to call for verification.   electronically signed by:  Felix Pacini, DO  [facility] [place]

## 2020-01-21 NOTE — Patient Instructions (Addendum)
Hydrate. Take your flonase every 12 hours  If needed, printed a z-pack for you in the event you need one.    Tinnitus Tinnitus refers to hearing a sound when there is no actual source for that sound. This is often described as ringing in the ears. However, people with this condition may hear a variety of noises, in one ear or in both ears. The sounds of tinnitus can be soft, loud, or somewhere in between. Tinnitus can last for a few seconds or can be constant for days. It may go away without treatment and come back at various times. When tinnitus is constant or happens often, it can lead to other problems, such as trouble sleeping and trouble concentrating. Almost everyone experiences tinnitus at some point. Tinnitus that is long-lasting (chronic) or comes back often (recurs) may require medical attention. What are the causes? The cause of tinnitus is often not known. In some cases, it can result from:  Exposure to loud noises from machinery, music, or other sources.  An object (foreign body) stuck in the ear.  Earwax buildup.  Drinking alcohol or caffeine.  Taking certain medicines.  Age-related hearing loss. It may also be caused by medical conditions such as:  Ear or sinus infections.  High blood pressure.  Heart diseases.  Anemia.  Allergies.  Meniere's disease.  Thyroid problems.  Tumors.  A weak, bulging blood vessel (aneurysm) near the ear. What are the signs or symptoms? The main symptom of tinnitus is hearing a sound when there is no source for that sound. It may sound like:  Buzzing.  Roaring.  Ringing.  Blowing air.  Hissing.  Whistling.  Sizzling.  Humming.  Running water.  A musical note.  Tapping. Symptoms may affect only one ear (unilateral) or both ears (bilateral). How is this diagnosed? Tinnitus is diagnosed based on your symptoms, your medical history, and a physical exam. Your health care provider may do a thorough hearing  test (audiologic exam) if your tinnitus:  Is unilateral.  Causes hearing difficulties.  Lasts 6 months or longer. You may work with a health care provider who specializes in hearing disorders (audiologist). You may be asked questions about your symptoms and how they affect your daily life. You may have other tests done, such as:  CT scan.  MRI.  An imaging test of how blood flows through your blood vessels (angiogram). How is this treated? Treating an underlying medical condition can sometimes make tinnitus go away. If your tinnitus continues, other treatments may include:  Medicines.  Therapy and counseling to help you manage the stress of living with tinnitus.  Sound generators to mask the tinnitus. These include: ? Tabletop sound machines that play relaxing sounds to help you fall asleep. ? Wearable devices that fit in your ear and play sounds or music. ? Acoustic neural stimulation. This involves using headphones to listen to music that contains an auditory signal. Over time, listening to this signal may change some pathways in your brain and make you less sensitive to tinnitus. This treatment is used for very severe cases when no other treatment is working.  Using hearing aids or cochlear implants if your tinnitus is related to hearing loss. Hearing aids are worn in the outer ear. Cochlear implants are surgically placed in the inner ear. Follow these instructions at home: Managing symptoms      When possible, avoid being in loud places and being exposed to loud sounds.  Wear hearing protection, such as  earplugs, when you are exposed to loud noises.  Use a white noise machine, a humidifier, or other devices to mask the sound of tinnitus.  Practice techniques for reducing stress, such as meditation, yoga, or deep breathing. Work with your health care provider if you need help with managing stress.  Sleep with your head slightly raised. This may reduce the impact of  tinnitus. General instructions  Do not use stimulants, such as nicotine, alcohol, or caffeine. Talk with your health care provider about other stimulants to avoid. Stimulants are substances that can make you feel alert and attentive by increasing certain activities in the body (such as heart rate and blood pressure). These substances may make tinnitus worse.  Take over-the-counter and prescription medicines only as told by your health care provider.  Try to get plenty of sleep each night.  Keep all follow-up visits as told by your health care provider. This is important. Contact a health care provider if:  Your tinnitus continues for 3 weeks or longer without stopping.  You develop sudden hearing loss.  Your symptoms get worse or do not get better with home care.  You feel you are not able to manage the stress of living with tinnitus. Get help right away if:  You develop tinnitus after a head injury.  You have tinnitus along with any of the following: ? Dizziness. ? Loss of balance. ? Nausea and vomiting. ? Sudden, severe headache. These symptoms may represent a serious problem that is an emergency. Do not wait to see if the symptoms will go away. Get medical help right away. Call your local emergency services (911 in the [place] Do not drive yourself to the hospital. Summary  Tinnitus refers to hearing a sound when there is no actual source for that sound. This is often described as ringing in the ears.  Symptoms may affect only one ear (unilateral) or both ears (bilateral).  Use a white noise machine, a humidifier, or other devices to mask the sound of tinnitus.  Do not use stimulants, such as nicotine, alcohol, or caffeine. Talk with your health care provider about other stimulants to avoid. These substances may make tinnitus worse. This information is not intended to replace advice given to you by your health care provider. Make sure you discuss any questions you have with  your health care provider. Document Revised: 08/02/2018 Document Reviewed: 10/28/2016 Elsevier Patient Education  2020 [employer].

## 2020-01-24 ENCOUNTER — Encounter: Payer: Self-pay | Admitting: Family Medicine

## 2020-01-28 ENCOUNTER — Encounter: Payer: Self-pay | Admitting: Family Medicine

## 2020-03-07 ENCOUNTER — Ambulatory Visit: Payer: 59 | Admitting: Internal Medicine

## 2020-03-09 ENCOUNTER — Encounter: Payer: Self-pay | Admitting: Family Medicine

## 2020-03-09 DIAGNOSIS — H9313 Tinnitus, bilateral: Secondary | ICD-10-CM

## 2020-03-10 NOTE — Telephone Encounter (Signed)
Sorry to hear he still has ringing in his hears despite using flonase. Please inform pt I have referred him to ENT for further evaluation.

## 2020-03-27 ENCOUNTER — Other Ambulatory Visit: Payer: Self-pay

## 2020-03-27 ENCOUNTER — Encounter (INDEPENDENT_AMBULATORY_CARE_PROVIDER_SITE_OTHER): Payer: Self-pay | Admitting: Otolaryngology

## 2020-03-27 ENCOUNTER — Ambulatory Visit (INDEPENDENT_AMBULATORY_CARE_PROVIDER_SITE_OTHER): Payer: 59 | Admitting: Otolaryngology

## 2020-03-27 VITALS — Temp 97.0°F

## 2020-03-27 DIAGNOSIS — H9313 Tinnitus, bilateral: Secondary | ICD-10-CM | POA: Diagnosis not present

## 2020-03-27 NOTE — Progress Notes (Signed)
HPI: Alejandro English is English 55 y.o. male who presents is referred by his PCP for evaluation of ringing in both ears that started sometime before Christmas.  He was tried on English Z-Pak as well as Flonase and some allergy medications without much benefit.  It does not bother him that much during the day but is more bothersome at night when things are quiet.  He denies loud noise exposure.. He has not noted any hearing problems.  Past Medical History:  Diagnosis Date  . Allergy   . Anal fissure 2005  . Chickenpox   . H/O syncope   . History of EKG 01/20/2017   normal  . History of UTI   . Hypertension 12/2016  . Internal hemorrhoids    follow by  Dr. Rhea English  . Kidney stone 2018   Incidental finding on left kidney ultrasound 2018.  . Osteoporosis 2019   dexa  . Vitamin D deficiency 2018   23   Past Surgical History:  Procedure Laterality Date  . ANAL FISTULOTOMY  2006  . COLONOSCOPY  07/02/2016   Normal, 10 year follow-up. Dr. Rhea English   Social History   Socioeconomic History  . Marital status: Divorced    Spouse name: Not on file  . Number of children: 2  . Years of education: 37  . Highest education level: Not on file  Occupational History  . Occupation: Geologist, engineering  Tobacco Use  . Smoking status: Never Smoker  . Smokeless tobacco: Never Used  Vaping Use  . Vaping Use: Never used  Substance and Sexual Activity  . Alcohol use: Yes    Comment: has one drink per week  . Drug use: No  . Sexual activity: Never    Partners: Female  Other Topics Concern  . Not on file  Social History Narrative   Single. Has 2 children. Works as English Gaffer.   MBA degree.   Wears English bicycle helmet. Wears his seatbelt.   Exercises routinely.   Social Determinants of Health   Financial Resource Strain: Not on file  Food Insecurity: Not on file  Transportation Needs: Not on file  Physical Activity: Not on file  Stress: Not on file  Social Connections: Not on file   Family  History  Problem Relation Age of Onset  . Prostate cancer Father   . Diverticulitis Father        part of colon removed  . Hypertension Father   . Hypertension Brother   . Other Maternal Grandmother        died at 61  . Brain cancer Maternal Grandfather   . Diverticulitis Paternal Grandmother   . Diabetes Mellitus I Daughter   . Colon cancer Neg Hx   . Stomach cancer Neg Hx   . Rectal cancer Neg Hx   . Esophageal cancer Neg Hx   . Liver cancer Neg Hx   . Colon polyps Neg Hx    Allergies  Allergen Reactions  . Penicillins Rash   Prior to Admission medications   Medication Sig Start Date End Date Taking? Authorizing Provider  azithromycin (ZITHROMAX) 250 MG tablet 500 mg day 1, 250 mg x4 days Patient not taking: Reported on 03/27/2020 01/21/20   Alejandro Pacini A, DO  Cholecalciferol (VITAMIN D3) 50 MCG (2000 UT) CAPS Take by mouth.     [provider]  fluticasone (FLONASE) 50 MCG/ACT nasal spray Place 2 sprays into both nostrils daily. 01/21/20   Kuneff, Renee A, DO  lisinopril (  ZESTRIL) 5 MG tablet Take 1 tablet (5 mg total) by mouth daily. 01/08/20   Kuneff, Renee A, DO  zoledronic acid (RECLAST) 5 MG/100ML SOLN injection Inject into the vein. Patient not taking: Reported on 01/21/2020 01/15/20   [provider]     Positive ROS: Otherwise negative  All other systems have been reviewed and were otherwise negative with the exception of those mentioned in the HPI and as above.  Physical Exam: Constitutional: Alert, well-appearing, no acute distress Ears: External ears without lesions or tenderness. Ear canals are clear bilaterally with intact, clear TMs bilaterally. Nasal: External nose without lesions. Septum relatively midline with mild rhinitis. Clear nasal passages otherwise. Oral: Lips and gums without lesions. Tongue and palate mucosa without lesions. Posterior oropharynx clear. Neck: No palpable adenopathy or masses Respiratory: Breathing comfortably   Skin: No facial/neck lesions or rash noted.  Audiogram demonstrated essentially normal hearing in both ears which was symmetric perhaps English very mild hearing loss in the very high-frequency 8000 frequency in the right ear only.  SRT's were 10 dB on the right and 5 dB on the left.  He had type English tympanograms bilaterally.  Procedures  Assessment: Bilateral tinnitus  Plan: Discussed with Alejandro English today concerning limited treatment options for tinnitus.  Discussed with him concerning using masking noise when things are quiet to help with the tinnitus. Also gave him samples of Lipo flavonoid to try as this is beneficial in some people with tinnitus. Discussed with him concerning using ear protection when around any loud noise as this can make tinnitus worse. Reassured him of normal hearing evaluation.  He will follow-up as needed.   Alejandro Bonds, MD   CC:

## 2020-04-04 ENCOUNTER — Encounter (INDEPENDENT_AMBULATORY_CARE_PROVIDER_SITE_OTHER): Payer: Self-pay

## 2020-06-20 ENCOUNTER — Encounter: Payer: Self-pay | Admitting: Family Medicine

## 2020-06-20 ENCOUNTER — Ambulatory Visit: Payer: 59 | Admitting: Family Medicine

## 2020-06-20 ENCOUNTER — Other Ambulatory Visit: Payer: Self-pay

## 2020-06-20 VITALS — BP 125/74 | HR 57 | Temp 98.2°F | Ht 70.0 in | Wt 180.0 lb

## 2020-06-20 DIAGNOSIS — I10 Essential (primary) hypertension: Secondary | ICD-10-CM

## 2020-06-20 DIAGNOSIS — M810 Age-related osteoporosis without current pathological fracture: Secondary | ICD-10-CM | POA: Diagnosis not present

## 2020-06-20 DIAGNOSIS — Z0000 Encounter for general adult medical examination without abnormal findings: Secondary | ICD-10-CM

## 2020-06-20 MED ORDER — LISINOPRIL 5 MG PO TABS: 5.0000 mg | ORAL_TABLET | Freq: Every day | ORAL | 1 refills | Status: DC

## 2020-06-20 NOTE — Patient Instructions (Signed)
Great to see you today.  I have refilled the medication(s) we provide.     Health Maintenance, Male Adopting a healthy lifestyle and getting preventive care are important in promoting health and wellness. Ask your health care provider about:  The right schedule for you to have regular tests and exams.  Things you can do on your own to prevent diseases and keep yourself healthy. What should I know about diet, weight, and exercise? Eat a healthy diet  Eat a diet that includes plenty of vegetables, fruits, low-fat dairy products, and lean protein.  Do not eat a lot of foods that are high in solid fats, added sugars, or sodium.   Maintain a healthy weight Body mass index (BMI) is a measurement that can be used to identify possible weight problems. It estimates body fat based on height and weight. Your health care provider can help determine your BMI and help you achieve or maintain a healthy weight. Get regular exercise Get regular exercise. This is one of the most important things you can do for your health. Most adults should:  Exercise for at least 150 minutes each week. The exercise should increase your heart rate and make you sweat (moderate-intensity exercise).  Do strengthening exercises at least twice a week. This is in addition to the moderate-intensity exercise.  Spend less time sitting. Even light physical activity can be beneficial. Watch cholesterol and blood lipids Have your blood tested for lipids and cholesterol at 55 years of age, then have this test every 5 years. You may need to have your cholesterol levels checked more often if:  Your lipid or cholesterol levels are high.  You are older than 55 years of age.  You are at high risk for heart disease. What should I know about cancer screening? Many types of cancers can be detected early and may often be prevented. Depending on your health history and family history, you may need to have cancer screening at  various ages. This may include screening for:  Colorectal cancer.  Prostate cancer.  Skin cancer.  Lung cancer. What should I know about heart disease, diabetes, and high blood pressure? Blood pressure and heart disease  High blood pressure causes heart disease and increases the risk of stroke. This is more likely to develop in people who have high blood pressure readings, are of African descent, or are overweight.  Talk with your health care provider about your target blood pressure readings.  Have your blood pressure checked: ? Every 3-5 years if you are 20-42 years of age. ? Every year if you are 58 years old or older.  If you are between the ages of 46 and 78 and are a current or former smoker, ask your health care provider if you should have a one-time screening for abdominal aortic aneurysm (AAA). Diabetes Have regular diabetes screenings. This checks your fasting blood sugar level. Have the screening done:  Once every three years after age 41 if you are at a normal weight and have a low risk for diabetes.  More often and at a younger age if you are overweight or have a high risk for diabetes. What should I know about preventing infection? Hepatitis B If you have a higher risk for hepatitis B, you should be screened for this virus. Talk with your health care provider to find out if you are at risk for hepatitis B infection. Hepatitis C Blood testing is recommended for:  Everyone born from [birth year] through 1965.  Anyone with known risk factors for hepatitis C. Sexually transmitted infections (STIs)  You should be screened each year for STIs, including gonorrhea and chlamydia, if: ? You are sexually active and are younger than 55 years of age. ? You are older than 55 years of age and your health care provider tells you that you are at risk for this type of infection. ? Your sexual activity has changed since you were last screened, and you are at increased risk for chlamydia  or gonorrhea. Ask your health care provider if you are at risk.  Ask your health care provider about whether you are at high risk for HIV. Your health care provider may recommend a prescription medicine to help prevent HIV infection. If you choose to take medicine to prevent HIV, you should first get tested for HIV. You should then be tested every 3 months for as long as you are taking the medicine. Follow these instructions at home: Lifestyle  Do not use any products that contain nicotine or tobacco, such as cigarettes, e-cigarettes, and chewing tobacco. If you need help quitting, ask your health care provider.  Do not use street drugs.  Do not share needles.  Ask your health care provider for help if you need support or information about quitting drugs. Alcohol use  Do not drink alcohol if your health care provider tells you not to drink.  If you drink alcohol: ? Limit how much you have to 0-2 drinks a day. ? Be aware of how much alcohol is in your drink. In the [place] one drink equals one 12 oz bottle of beer (355 mL), one 5 oz glass of wine (148 mL), or one 1 oz glass of hard liquor (44 mL). General instructions  Schedule regular health, dental, and eye exams.  Stay current with your vaccines.  Tell your health care provider if: ? You often feel depressed. ? You have ever been abused or do not feel safe at home. Summary  Adopting a healthy lifestyle and getting preventive care are important in promoting health and wellness.  Follow your health care provider's instructions about healthy diet, exercising, and getting tested or screened for diseases.  Follow your health care provider's instructions on monitoring your cholesterol and blood pressure. This information is not intended to replace advice given to you by your health care provider. Make sure you discuss any questions you have with your health care provider. Document Revised: 01/11/2018 Document Reviewed:  01/11/2018 Elsevier Patient Education  2021 [employer].

## 2020-06-20 NOTE — Progress Notes (Signed)
This visit occurred during the SARS-CoV-2 public health emergency.  Safety protocols were in place, including screening questions prior to the visit, additional usage of staff PPE, and extensive cleaning of exam room while observing appropriate contact time as indicated for disinfecting solutions.    Patient ID: Alejandro English, male  DOB: 09/11/1965, 55 y.o.   MRN: [id number] Patient Care Team    Relationship Specialty Notifications Start End  Natalia Leatherwood, DO PCP - General Family Medicine  12/02/16   Pyrtle, Carie Caddy, MD Consulting Physician Gastroenterology  06/20/20   Shamleffer, Konrad Dolores, MD Consulting Physician Endocrinology  06/20/20   Drema Halon, MD Consulting Physician Otolaryngology  06/20/20   Hassell Done, MD Referring Physician Endocrinology  06/20/20   Ovidio Kin, MD Attending Physician Nephrology  06/20/20    Comment: Erma Heritage, MD    Chief Complaint  Patient presents with  . Follow-up    [facility]; pt is not fasting  . Hypertension    Subjective:  Alejandro English is a 55 y.o. male present for CPE. All past medical history, surgical history, allergies, family history, immunizations, medications and social history were updated in the electronic medical record today. All recent labs, ED visits and hospitalizations within the last year were reviewed.  Health maintenance:  Colonoscopy: last screen 08/01/2016, recommend follow up 10 ys;  Completed by Dr. Rhea Belton. Immunizations:  tdap 06/19/2018 uTD, influenza UTD 2021,  zostavax series completed, covid series completed with booster Infectious disease screening: HIV and  Hep C completed PSA:  Lab Results  Component Value Date   PSA 1.61 04/10/2018   PSA 1.80 01/20/2017   PSA 2.01 12/02/2016  , pt was counseled on prostate cancer screenings.  DEXA: 11/12/2019> osteoporosis. Under tx through endocrine.  Assistive device: reviewed Oxygen use: none Patient has a Dental home. Hospitalizations/ED visits:  reviewed  Hypertension:  Pt reports compliance  with lisinopril 10 mg. Blood pressures ranges at home within normal limits. Patient denies chest pain, shortness of breath, dizziness or lower extremity edema.  Pt is taking a daily baby ASA and has questions on need. Pt is not prescribed statin.  Diet/exercise: Low-sodium, exercises routinely. RF: Hypertension   Depression screen [facility] 2/9 01/08/2020 06/06/2017 12/02/2016  Decreased Interest 0 0 0  Down, Depressed, Hopeless 0 0 0  PHQ - 2 Score 0 0 0   No flowsheet data found.       Fall Risk  12/02/2016  Falls in the past year? No      Immunization History  Administered Date(s) Administered  . Influenza,inj,Quad PF,6+ Mos 12/02/2016, 12/05/2017, 10/20/2018, 11/12/2019  . Influenza,inj,quad, With Preservative 11/01/2017  . Influenza-Unspecified 12/02/2016  . Moderna Sars-Covid-2 Vaccination 04/26/2019, 05/24/2019, 12/28/2019  . PFIZER(Purple Top)SARS-COV-2 Vaccination 05/08/2020  . Tdap 06/19/2018  . Zoster 12/02/2016  . Zoster Recombinat (Shingrix) 12/03/2016, 02/07/2017     Past Medical History:  Diagnosis Date  . Allergy   . Anal fissure 2005  . Chickenpox   . H/O syncope   . History of EKG 01/20/2017   normal  . History of UTI   . Hypertension 12/2016  . Internal hemorrhoids    follow by  Dr. Rhea Belton  . Kidney stone 2018   Incidental finding on left kidney ultrasound 2018.  . Osteoporosis 2019   dexa  . Situational stress 12/17/2019  . Stone in kidney 01/01/2017   Formatting of this note might be different from the original. 3 mm stone in left kidney seen on U/S  of kidneys in both Dec 2018 and again in March 2020  . Vitamin D deficiency 2018   23   Allergies  Allergen Reactions  . Penicillins Rash   Past Surgical History:  Procedure Laterality Date  . ANAL FISTULOTOMY  2006  . COLONOSCOPY  07/02/2016   Normal, 10 year follow-up. Dr. Rhea Belton   Family History  Problem Relation Age of Onset  . Prostate  cancer Father   . Diverticulitis Father        part of colon removed  . Hypertension Father   . Hypertension Brother   . Other Maternal Grandmother        died at 55  . Brain cancer Maternal Grandfather   . Diverticulitis Paternal Grandmother   . Diabetes Mellitus I Daughter   . Colon cancer Neg Hx   . Stomach cancer Neg Hx   . Rectal cancer Neg Hx   . Esophageal cancer Neg Hx   . Liver cancer Neg Hx   . Colon polyps Neg Hx    Social History   Social History Narrative   Single. Has 2 children. Works as a Gaffer.   MBA degree.   Wears a bicycle helmet. Wears his seatbelt.   Exercises routinely.    Allergies as of 06/20/2020      Reactions   Penicillins Rash      Medication List       Accurate as of Jun 20, 2020  6:10 PM. If you have any questions, ask your nurse or doctor.        STOP taking these medications   azithromycin 250 MG tablet Commonly known as: ZITHROMAX Stopped by: Felix Pacini, DO   fluticasone 50 MCG/ACT nasal spray Commonly known as: FLONASE Stopped by: Felix Pacini, DO   zoledronic acid 5 MG/100ML Soln injection Commonly known as: RECLAST Stopped by: Felix Pacini, DO     TAKE these medications   lisinopril 5 MG tablet Commonly known as: ZESTRIL Take 1 tablet (5 mg total) by mouth daily.   vitamin D3 50 MCG (2000 UT) Caps Take by mouth.      All past medical history, surgical history, allergies, family history, immunizations andmedications were updated in the EMR today and reviewed under the history and medication portions of their EMR.     No results found for this or any previous visit (from the past 2160 hour(s)).  DG Bone Density Result Date: 09/28/2019  Assessment: Patient has OSTEOPOROSIS   ROS: 14 pt review of systems performed and negative (unless mentioned in an HPI)  Objective: BP 125/74   Pulse (!) 57   Temp 98.2 F (36.8 C) (Oral)   Ht 5\' 10"  (1.778 m)   Wt 180 lb (81.6 kg)   SpO2 97%   BMI 25.83 kg/m   Gen: Afebrile. No acute distress.  Nontoxic, pleasant, male. HENT: AT. Micro. Bilateral TM visualized and normal in appearance. MMM. Bilateral nares without erythema, swelling or drainage. Throat without erythema or exudates.  No cough.  No hoarseness. Eyes:Pupils Equal Round Reactive to light, Extraocular movements intact,  Conjunctiva without redness, discharge or icterus. Neck/lymp/endocrine: Supple, no lymphadenopathy, no thyromegaly CV: RRR no murmur, no edema, +2/4 P posterior tibialis pulses Chest: CTAB, no wheeze or crackles Abd: Soft.  Flat. NTND. BS present.  No masses palpated.  Skin: No rashes, purpura or petechiae.  Neuro:  Normal gait. PERLA. EOMi. Alert. Oriented x3 Psych: Normal affect, dress and demeanor. Normal speech. Normal thought content and judgment.  No exam data present  Assessment/plan: LEVEON PELZER is a 55 y.o. male present for CPE Essential hypertension -Stable. - Continue lisinopril 5 mg daily - dicussed benefits vs risk with ASA 81 mg. - diet and routine exercise recommended.  - labs UTD-[facility] - Follow-up 5.5 months  Osteoporosis without current pathological fracture, unspecified osteoporosis type Discussion today with patient surrounding his osteoporosis and the ongoing attempt at a diagnosis of cause.  He has now been to two endocrinologist and nephrologist without identifiable cause.  He is ideally wanting to treat his osteoporosis with Reclast.  He is hoping after his next follow-up he can start a treatment plan for his osteoporosis, otherwise he feels he continues to have multiple tests completed, but no treatment has been initiated.  He is very understanding and knows that his specialty team is trying her hardest and consulting other specialist to ensure that it is the right step for him and nothing else needs to be completed first.   Routine general medical examination at a health care facility Colonoscopy: last screen 08/01/2016, recommend follow  up 10 ys;  Completed by Dr. Rhea Belton. Immunizations:  tdap 06/19/2018 uTD, influenza UTD 2021,  zostavax series completed, covid series completed with booster Infectious disease screening: HIV and  Hep C completed Patient was encouraged to exercise greater than 150 minutes a week. Patient was encouraged to choose a diet filled with fresh fruits and vegetables, and lean meats. AVS provided to patient today for education/recommendation on gender specific health and safety maintenance. Reviewed all labs from EMR collected [facility] the last year.  No follow-ups on file.  No orders of the defined types were placed in this encounter.  No orders of the defined types were placed in this encounter.  Meds ordered this encounter  Medications  . lisinopril (ZESTRIL) 5 MG tablet    Sig: Take 1 tablet (5 mg total) by mouth daily.    Dispense:  90 tablet    Refill:  1   Referral Orders  No referral(s) requested today     Note is dictated utilizing voice recognition software. Although note has been proof read prior to signing, occasional typographical errors still can be missed. If any questions arise, please do not hesitate to call for verification.  Electronically signed by: Felix Pacini, DO [facility] [place]

## 2020-12-03 ENCOUNTER — Encounter: Payer: Self-pay | Admitting: Family Medicine

## 2020-12-03 NOTE — Telephone Encounter (Signed)
FYI

## 2020-12-05 ENCOUNTER — Ambulatory Visit: Payer: 59 | Admitting: Family Medicine

## 2020-12-05 ENCOUNTER — Encounter: Payer: Self-pay | Admitting: Family Medicine

## 2020-12-05 ENCOUNTER — Other Ambulatory Visit: Payer: Self-pay

## 2020-12-05 VITALS — BP 121/66 | HR 59 | Temp 98.6°F | Ht 70.0 in | Wt 179.0 lb

## 2020-12-05 DIAGNOSIS — M810 Age-related osteoporosis without current pathological fracture: Secondary | ICD-10-CM

## 2020-12-05 DIAGNOSIS — I10 Essential (primary) hypertension: Secondary | ICD-10-CM

## 2020-12-05 DIAGNOSIS — E559 Vitamin D deficiency, unspecified: Secondary | ICD-10-CM | POA: Diagnosis not present

## 2020-12-05 MED ORDER — LISINOPRIL 5 MG PO TABS: 5.0000 mg | ORAL_TABLET | Freq: Every day | ORAL | 1 refills | Status: DC

## 2020-12-05 NOTE — Patient Instructions (Signed)
Lipoproteins Test °Why am I having this test? °Lipoproteins are substances that move cholesterol and fats through the bloodstream. You may have the lipoproteins test to evaluate your risk for developing heart disease. °What is being tested? °This test measures lipoproteins in your blood. Lipoproteins transport cholesterol, triglycerides, and other fats to and from your tissues and your liver. There are three main types of lipoproteins: °High-density lipoproteins (HDL). This is sometimes called good cholesterol. Having high levels of HDL decreases your risk for heart disease. °Low-density lipoproteins (LDL). This is sometimes called bad cholesterol. Having high levels of LDL increases your risk for heart disease. °Very low-density lipoproteins (VLDL). A high level of VLDL also increases your risk for heart disease. °The lipoproteins test may measure one, two, or all three of these lipoproteins. When all types of lipoproteins are measured together, the test is called a lipid profile test. °What kind of sample is taken? °A blood sample is required for this test. It is usually collected by inserting a needle into a blood vessel or by sticking a finger with a small needle. °How do I prepare for this test? °Follow instructions from your health care provider about changing or stopping your regular medicines. °Do not eat or drink anything except water starting 9-12 hours before your test, or as long as told by your health care provider. °Do not drink alcohol starting at least 24 hours before your test. °Follow any instructions from your health care provider about dietary restrictions before your test. °Tell a health care provider about: °Any allergies you have. °All medicines you are taking, including vitamins, herbs, eye drops, creams, and over-the-counter medicines. °Any bleeding problems you have. °Any surgeries you have had. °Any medical conditions you have. °Whether you are pregnant or may be pregnant. °How are the  results reported? °Your test results will be reported as values that indicate how much of each lipoprotein is in your blood. This is usually given as milligrams of lipoprotein per deciliter of blood (mg/dL). °Your health care provider will compare your results to normal ranges that were established after testing a large group of people (reference ranges). Reference ranges may vary among labs and hospitals. For this test, common reference ranges are: °HDL: °Male: greater than 45 mg/dL. °Male: greater than 55 mg/dL. °LDL: °Adult: less than 130 mg/dL. °Children: less than 110 mg/dL. °VLDL: 7-32 mg/dL. °The VLDL result may also be given as a percentage of total cholesterol. VLDL levels that are higher than 25-50% of total cholesterol are associated with an increased risk of heart disease. °What do the results mean? °Your HDL results will be used to describe your risk for heart disease as low, moderate, or high. °You are at low risk for heart disease if your HDL result is: °Men: greater than 60 mg/dL. °Women: greater than 70 mg/dL. °You are at moderate risk for heart disease if your HDL result is: °Men: 45-59 mg/dL. °Women: 55-69 mg/dL. °You are at high risk for heart disease if your HDL result is: °Men: 25-44 mg/dL. °Women: 35-54 mg/dL. °Having a low level of HDL along with an LDL level that is higher than 130 mg/dL also increases your risk for heart disease. Based on your risk for heart disease, your health care provider will determine a target LDL level for you. °If you are at low risk, your LDL should be 130 mg/dL or less. °If you are at moderate risk, your LDL should be 100 mg/dL or less. °If you are at high risk, your LDL   should be less than 70 mg/dL. °A VLDL level that is higher than 32 mg/dL or above 25-50% of total cholesterol increases your risk for heart disease. °You may need to make lifestyle changes or take medicines to lower your LDL cholesterol and decrease your risk for heart disease. °Talk with your  health care provider about your individual risk and what your results mean. °Questions to ask your health care provider °Ask your health care provider, or the department that is doing the test: °When will my results be ready? °How will I get my results? °What are my treatment options? °What other tests do I need? °What are my next steps? °Summary °Lipoproteins are substances that move cholesterol and fats through your bloodstream. The lipoproteins test helps evaluate your risk for developing heart disease. °There are three main types of lipoproteins: high-density lipoproteins (HDL), low-density lipoproteins (LDL), and very low-density lipoproteins (VLDL). °Having a high level of HDL and low levels of LDL and VLDL decreases your risk for heart disease. °Talk with your health care provider about what your results mean. °This information is not intended to replace advice given to you by your health care provider. Make sure you discuss any questions you have with your health care provider. °Document Revised: 04/30/2020 Document Reviewed: 04/30/2020 °Elsevier Patient Education © 2022 [employer] ° °

## 2020-12-05 NOTE — Progress Notes (Signed)
This visit occurred during the SARS-CoV-2 public health emergency.  Safety protocols were in place, including screening questions prior to the visit, additional usage of staff PPE, and extensive cleaning of exam room while observing appropriate contact time as indicated for disinfecting solutions.    Patient ID: Alejandro English, male  DOB: 11-23-1965, 55 y.o.   MRN: [id number] Patient Care Team    Relationship Specialty Notifications Start End  Natalia Leatherwood, DO PCP - General Family Medicine  12/02/16   Pyrtle, Carie Caddy, MD Consulting Physician Gastroenterology  06/20/20   Shamleffer, Konrad Dolores, MD Consulting Physician Endocrinology  06/20/20   Drema Halon, MD Consulting Physician Otolaryngology  06/20/20   Hassell Done, MD Referring Physician Endocrinology  06/20/20   Ovidio Kin, MD Attending Physician Nephrology  06/20/20    Comment: Erma Heritage, MD  Reche Dixon Tandy Gaw, MD Referring Physician Dermatology  12/05/20     Chief Complaint  Patient presents with   Hypertension    CMC; pt is not fasting    Subjective:  Alejandro English is a 55 y.o. male present for cmc All past medical history, surgical history, allergies, family history, immunizations, medications and social history were updated in the electronic medical record today. All recent labs, ED visits and hospitalizations within the last year were reviewed.  Hypertension:  Pt reports compliance with lisinopril 5 mg.  Blood pressures ranges at home within normal limits. Patient denies chest pain, shortness of breath, dizziness or lower extremity edema.  Pt is taking a daily baby ASA and has questions on need.  Pt is not prescribed statin.  Diet/exercise: Low-sodium, exercises routinely. RF: Hypertension    Depression screen [facility] 2/9 12/05/2020 01/08/2020 06/06/2017 12/02/2016  Decreased Interest 0 0 0 0  Down, Depressed, Hopeless 0 0 0 0  PHQ - 2 Score 0 0 0 0   No flowsheet data found.     Fall Risk   12/02/2016  Falls in the past year? No   Immunization History  Administered Date(s) Administered   Influenza Split 11/12/2019   Influenza,inj,Quad PF,6+ Mos 12/02/2016, 12/05/2017, 10/20/2018, 11/12/2019, 11/11/2020   Influenza,inj,quad, With Preservative 11/01/2017   Influenza-Unspecified 12/02/2016, 12/05/2017, 10/20/2018, 11/12/2019   Moderna Sars-Covid-2 Vaccination 04/26/2019, 05/24/2019, 12/28/2019   PFIZER(Purple Top)SARS-COV-2 Vaccination 05/08/2020   Tdap 06/19/2018   Unspecified SARS-COV-2 Vaccination 05/08/2020   Zoster Recombinat (Shingrix) 12/03/2016, 02/07/2017   Zoster, Live 12/02/2016   Past Medical History:  Diagnosis Date   Allergy    Anal fissure 2005   Chickenpox    H/O syncope    History of EKG 01/20/2017   normal   History of UTI    Hypertension 12/2016   Internal hemorrhoids    follow by  Dr. Rhea Belton   Kidney stone 2018   Incidental finding on left kidney ultrasound 2018.   Osteoporosis 2019   dexa   Situational stress 12/17/2019   Stone in kidney 01/01/2017   Formatting of this note might be different from the original. 3 mm stone in left kidney seen on U/S of kidneys in both Dec 2018 and again in March 2020   Vitamin D deficiency 2018   23   Allergies  Allergen Reactions   Penicillins Rash   Past Surgical History:  Procedure Laterality Date   ANAL FISTULOTOMY  2006   COLONOSCOPY  07/02/2016   Normal, 10 year follow-up. Dr. 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   MBA degree.   Wears a bicycle helmet. Wears his seatbelt.   Exercises routinely.    Allergies as of 12/05/2020       Reactions   Penicillins Rash        Medication List        Accurate as of December 05, 2020  4:51 PM. If you have any questions, ask your nurse or doctor.          lisinopril 5 MG tablet Commonly known as: ZESTRIL Take 1 tablet (5 mg total) by mouth daily.   vitamin D3 50 MCG (2000 UT) Caps Take by mouth.   zoledronic acid 5 MG/100ML Soln injection Commonly known as: RECLAST Inject into the vein.       All past medical history, surgical history, allergies, family history, immunizations andmedications were updated in the EMR today and reviewed under the history and medication portions of their EMR.     No results found for this or any previous visit (from the past 2160 hour(s)).  DG Bone Density Result Date: 09/28/2019  Assessment: Patient has OSTEOPOROSIS   ROS: 14 pt review of systems performed and negative (unless mentioned in an HPI)  Objective: BP 121/66   Pulse (!) 59   Temp 98.6 F (37 C) (Oral)   Ht 5\' 10"  (1.778 m)   Wt 179 lb (81.2 kg)   SpO2 97%   BMI 25.68 kg/m  Gen: Afebrile. No acute distress.  Nontoxic, very pleasant male. HENT: AT. [place].  Eyes:Pupils Equal Round Reactive to light, Extraocular movements intact,  Conjunctiva without redness, discharge or icterus. Neck/lymp/endocrine: Supple, no lymphadenopathy, no thyromegaly CV: RRR no murmur, no edema, +2/4 P posterior tibialis pulses Chest: CTAB, no wheeze or crackles Neuro:  Normal gait. PERLA. EOMi. Alert. Oriented x3 Psych: Normal affect, dress and demeanor. Normal speech. Normal thought content and judgment.   No results found.  Assessment/plan: Alejandro English is a 55 y.o. male present for  Essential hypertension -Stable -Continue lisinopril 5 mg daily - dicussed benefits vs risk with ASA  81 mg.  - diet and routine exercise recommended.  - labs UTD-[facility]- reviewed - Follow-up 5.5 months  Osteoporosis /renal Phosphaturia/hypophosphatemia/vitamin D deficiency Discussion today with patient surrounding his osteoporosis and the ongoing attempt at a diagnosis of cause.  He has now been to two endocrinologist and nephrologist without identifiable cause.  He is ideally wanting to treat his osteoporosis with Reclast.  He is hoping after his next follow-up he can start a treatment plan for his osteoporosis, otherwise he feels he continues to have multiple tests completed, but no treatment has been initiated.    Return in about 7 months (around 06/22/2021) for CPE (30 min).  No orders of the defined types were placed in this encounter.  No  orders of the defined types were placed in this encounter.  Meds ordered this encounter  Medications   lisinopril (ZESTRIL) 5 MG tablet    Sig: Take 1 tablet (5 mg total) by mouth daily.    Dispense:  90 tablet    Refill:  1    Referral Orders  No referral(s) requested today     Note is dictated utilizing voice recognition software. Although note has been proof read prior to signing, occasional typographical errors still can be missed. If any questions arise, please do not hesitate to call for verification.  Electronically signed by: Felix Pacini, DO [facility] [place]

## 2020-12-05 NOTE — Progress Notes (Signed)
This visit occurred during the SARS-CoV-2 public health emergency.  Safety protocols were in place, including screening questions prior to the visit, additional usage of staff PPE, and extensive cleaning of exam room while observing appropriate contact time as indicated for disinfecting solutions.    Patient ID: Alejandro English, male  DOB: 1965-04-28, 55 y.o.   MRN: [id number] Patient Care Team    Relationship Specialty Notifications Start End  Natalia Leatherwood, DO PCP - General Family Medicine  12/02/16   Pyrtle, Carie Caddy, MD Consulting Physician Gastroenterology  06/20/20   Shamleffer, Konrad Dolores, MD Consulting Physician Endocrinology  06/20/20   Drema Halon, MD Consulting Physician Otolaryngology  06/20/20   Hassell Done, MD Referring Physician Endocrinology  06/20/20   Ovidio Kin, MD Attending Physician Nephrology  06/20/20    Comment: Erma Heritage, MD  Reche Dixon Tandy Gaw, MD Referring Physician Dermatology  12/05/20     Chief Complaint  Patient presents with   Hypertension    CMC; pt is not fasting    Subjective:  Alejandro English is a 55 y.o. male present for CPE. All past medical history, surgical history, allergies, family history, immunizations, medications and social history were updated in the electronic medical record today. All recent labs, ED visits and hospitalizations within the last year were reviewed.  Health maintenance:  Colonoscopy: last screen 08/01/2016, recommend follow up 10 ys;  Completed by Dr. Rhea Belton. Immunizations:  tdap 06/19/2018 uTD, influenza UTD 2021,  zostavax series completed, covid series completed with booster Infectious disease screening: HIV and  Hep C completed PSA:  Lab Results  Component Value Date   PSA 1.61 04/10/2018   PSA 1.80 01/20/2017   PSA 2.01 12/02/2016  , pt was counseled on prostate cancer screenings.  DEXA: 11/12/2019> osteoporosis. Under tx through endocrine.  Assistive device: reviewed Oxygen use:  none Patient has a Dental home. Hospitalizations/ED visits: reviewed  Hypertension:  Pt reports compliance  with lisinopril 10 mg.  Blood pressures ranges at home within normal limits. Patient denies chest pain, shortness of breath, dizziness or lower extremity edema.   Pt is taking a daily baby ASA and has questions on need.  Pt is not prescribed statin.  Diet/exercise: Low-sodium, exercises routinely. RF: Hypertension    Depression screen [facility] 2/9 12/05/2020 01/08/2020 06/06/2017 12/02/2016  Decreased Interest 0 0 0 0  Down, Depressed, Hopeless 0 0 0 0  PHQ - 2 Score 0 0 0 0   No flowsheet data found.       Fall Risk  12/02/2016  Falls in the past year? No      Immunization History  Administered Date(s) Administered   Influenza Split 11/12/2019   Influenza,inj,Quad PF,6+ Mos 12/02/2016, 12/05/2017, 10/20/2018, 11/12/2019, 11/11/2020   Influenza,inj,quad, With Preservative 11/01/2017   Influenza-Unspecified 12/02/2016, 12/05/2017, 10/20/2018, 11/12/2019   Moderna Sars-Covid-2 Vaccination 04/26/2019, 05/24/2019, 12/28/2019   PFIZER(Purple Top)SARS-COV-2 Vaccination 05/08/2020   Tdap 06/19/2018   Unspecified SARS-COV-2 Vaccination 05/08/2020   Zoster Recombinat (Shingrix) 12/03/2016, 02/07/2017   Zoster, Live 12/02/2016     Past Medical History:  Diagnosis Date   Allergy    Anal fissure 2005   Chickenpox    H/O syncope    History of EKG 01/20/2017   normal   History of UTI    Hypertension 12/2016   Internal hemorrhoids    follow by  Dr. Rhea Belton   Kidney stone 2018   Incidental finding on left kidney ultrasound 2018.   Osteoporosis 2019   dexa  Situational stress 12/17/2019   Stone in kidney 01/01/2017   Formatting of this note might be different from the original. 3 mm stone in left kidney seen on U/S of kidneys in both Dec 2018 and again in March 2020   Vitamin D deficiency 2018   23   Allergies  Allergen Reactions   Penicillins Rash   Past Surgical History:   Procedure Laterality Date   ANAL FISTULOTOMY  2006   COLONOSCOPY  07/02/2016   Normal, 10 year follow-up. Dr. Rhea Belton   Family History  Problem Relation Age of Onset   Prostate cancer Father    Diverticulitis Father        part of colon removed   Hypertension Father    Hypertension Brother    Other Maternal Grandmother        died at 46   Brain cancer Maternal Grandfather    Diverticulitis Paternal Grandmother    Diabetes Mellitus I Daughter    Colon cancer Neg Hx    Stomach cancer Neg Hx    Rectal cancer Neg Hx    Esophageal cancer Neg Hx    Liver cancer Neg Hx    Colon polyps Neg Hx    Social History   Social History Narrative   Single. Has 2 children. Works as a Gaffer.   MBA degree.   Wears a bicycle helmet. Wears his seatbelt.   Exercises routinely.    Allergies as of 12/05/2020       Reactions   Penicillins Rash        Medication List        Accurate as of December 05, 2020  4:53 PM. If you have any questions, ask your nurse or doctor.          STOP taking these medications    zoledronic acid 5 MG/100ML Soln injection Commonly known as: RECLAST Stopped by: Felix Pacini, DO       TAKE these medications    lisinopril 5 MG tablet Commonly known as: ZESTRIL Take 1 tablet (5 mg total) by mouth daily.   vitamin D3 50 MCG (2000 UT) Caps Take by mouth.       All past medical history, surgical history, allergies, family history, immunizations andmedications were updated in the EMR today and reviewed under the history and medication portions of their EMR.     No results found for this or any previous visit (from the past 2160 hour(s)).  DG Bone Density Result Date: 09/28/2019  Assessment: Patient has OSTEOPOROSIS   ROS: 14 pt review of systems performed and negative (unless mentioned in an HPI)  Objective: BP 121/66   Pulse (!) 59   Temp 98.6 F (37 C) (Oral)   Ht 5\' 10"  (1.778 m)   Wt 179 lb (81.2 kg)   SpO2 97%   BMI  25.68 kg/m  Gen: Afebrile. No acute distress.  Nontoxic, pleasant, male. HENT: AT. [place]. Bilateral TM visualized and normal in appearance. MMM. Bilateral nares without erythema, swelling or drainage. Throat without erythema or exudates.  No cough.  No hoarseness. Eyes:Pupils Equal Round Reactive to light, Extraocular movements intact,  Conjunctiva without redness, discharge or icterus. Neck/lymp/endocrine: Supple, no lymphadenopathy, no thyromegaly CV: RRR no murmur, no edema, +2/4 P posterior tibialis pulses Chest: CTAB, no wheeze or crackles Abd: Soft.  Flat. NTND. BS present.  No masses palpated.  Skin: No rashes, purpura or petechiae.  Neuro:  Normal gait. PERLA. EOMi. Alert. Oriented x3 Psych: Normal affect, dress  and demeanor. Normal speech. Normal thought content and judgment.    No results found.  Assessment/plan: GARRET TEALE is a 55 y.o. male present for  Essential hypertension - stable - continue lisinopril 5 mg daily - dicussed benefits vs risk with ASA 81 mg.  - diet and routine exercise recommended.  - labs UTD-[facility]- reviewed - Follow-up 5.5 months  Osteoporosis without current pathological fracture, unspecified osteoporosis type Discussion today with patient surrounding his osteoporosis and the ongoing attempt at a diagnosis of cause.  He has now been to two endocrinologist and nephrologist without identifiable cause.  He is ideally wanting to treat his osteoporosis with Reclast.  He is hoping after his next follow-up he can start a treatment plan for his osteoporosis, otherwise he feels he continues to have multiple tests completed, but no treatment has been initiated.  He is very understanding and knows that his specialty team is trying her hardest and consulting other specialist to ensure that it is the right step for him and nothing else needs to be completed first.     Return in about 7 months (around 06/22/2021) for CPE (30 min).  No orders of the defined types  were placed in this encounter.  No orders of the defined types were placed in this encounter.  Meds ordered this encounter  Medications   lisinopril (ZESTRIL) 5 MG tablet    Sig: Take 1 tablet (5 mg total) by mouth daily.    Dispense:  90 tablet    Refill:  1    Referral Orders  No referral(s) requested today     Note is dictated utilizing voice recognition software. Although note has been proof read prior to signing, occasional typographical errors still can be missed. If any questions arise, please do not hesitate to call for verification.  Electronically signed by: Felix Pacini, DO [facility] [place]

## 2020-12-09 ENCOUNTER — Encounter: Payer: Self-pay | Admitting: Family Medicine

## 2020-12-09 NOTE — Telephone Encounter (Signed)
Please advise if pt should start ASA

## 2020-12-16 ENCOUNTER — Encounter: Payer: Self-pay | Admitting: Family Medicine

## 2020-12-16 NOTE — Telephone Encounter (Signed)
FYI

## 2021-01-14 ENCOUNTER — Encounter: Payer: Self-pay | Admitting: Family Medicine

## 2021-06-19 ENCOUNTER — Encounter: Payer: 59 | Admitting: Family Medicine

## 2021-06-22 ENCOUNTER — Encounter: Payer: 59 | Admitting: Family Medicine

## 2021-07-10 ENCOUNTER — Ambulatory Visit: Payer: 59 | Admitting: Family Medicine

## 2021-07-10 ENCOUNTER — Other Ambulatory Visit: Payer: Self-pay | Admitting: Family Medicine

## 2021-07-10 ENCOUNTER — Encounter: Payer: Self-pay | Admitting: Family Medicine

## 2021-07-10 VITALS — BP 104/64 | HR 60 | Temp 97.6°F | Ht 70.0 in | Wt 182.0 lb

## 2021-07-10 DIAGNOSIS — E559 Vitamin D deficiency, unspecified: Secondary | ICD-10-CM

## 2021-07-10 DIAGNOSIS — I10 Essential (primary) hypertension: Secondary | ICD-10-CM | POA: Diagnosis not present

## 2021-07-10 DIAGNOSIS — M810 Age-related osteoporosis without current pathological fracture: Secondary | ICD-10-CM

## 2021-07-10 MED ORDER — LISINOPRIL 5 MG PO TABS: 5.0000 mg | ORAL_TABLET | Freq: Every day | ORAL | 1 refills | Status: DC

## 2021-07-10 NOTE — Patient Instructions (Addendum)
Return in about 24 weeks (around 12/25/2021) for Routine chronic condition follow-up.        Great to see you today.  I have refilled the medication(s) we provide.   If labs were collected, we will inform you of lab results once received either by echart message or telephone call.   - echart message- for normal results that have been seen by the patient already.   - telephone call: abnormal results or if patient has not viewed results in their echart.

## 2021-07-10 NOTE — Progress Notes (Signed)
Patient ID: Alejandro English, male  DOB: 1965/09/12, 56 y.o.   MRN: [id number] Patient Care Team    Relationship Specialty Notifications Start End  Ma Hillock, DO PCP - General Family Medicine  12/02/16   Pyrtle, Lajuan Lines, MD Consulting Physician Gastroenterology  06/20/20   Shamleffer, Melanie Crazier, MD Consulting Physician Endocrinology  06/20/20   Rozetta Nunnery, MD (Inactive) Consulting Physician Otolaryngology  06/20/20   Kathlene Cote, MD Referring Physician Endocrinology  06/20/20   Claudette Stapler, MD Attending Physician Nephrology  06/20/20    Comment: Elby Beck, MD  Sharol Roussel Elwyn Lade, MD Referring Physician Dermatology  12/05/20     Chief Complaint  Patient presents with   Hypertension    Pt is not fasting    Subjective: Alejandro English is a 56 y.o. male present for cmc All past medical history, surgical history, allergies, family history, immunizations, medications and social history were updated in the electronic medical record today. All recent labs, ED visits and hospitalizations within the last year were reviewed.  Hypertension:  Pt reports compliance  with lisinopril 5 mg.  Blood pressures ranges at home within normal limits. Patient denies chest pain, shortness of breath, dizziness or lower extremity edema.  Pt is taking a daily baby ASA and has questions on need.  Pt is not prescribed statin.  Diet/exercise: Low-sodium, exercises routinely. RF: Hypertension       12/05/2020    3:22 PM 01/08/2020    1:10 PM 06/06/2017    3:24 PM 12/02/2016    9:35 AM  Depression screen PHQ 2/9  Decreased Interest 0 0 0 0  Down, Depressed, Hopeless 0 0 0 0  PHQ - 2 Score 0 0 0 0       No data to display                12/02/2016    9:35 AM  Fall Risk   Falls in the past year? No   Immunization History  Administered Date(s) Administered   Influenza Split 11/12/2019   Influenza,inj,Quad PF,6+ Mos 12/02/2016, 12/05/2017, 10/20/2018, 11/12/2019,  11/11/2020   Influenza,inj,quad, With Preservative 11/01/2017   Influenza-Unspecified 12/02/2016, 12/05/2017, 10/20/2018, 11/12/2019   Moderna Sars-Covid-2 Vaccination 04/26/2019, 05/24/2019, 12/28/2019   PFIZER(Purple Top)SARS-COV-2 Vaccination 05/08/2020   Tdap 06/19/2018   Unspecified SARS-COV-2 Vaccination 05/08/2020   Zoster Recombinat (Shingrix) 12/03/2016, 02/07/2017   Zoster, Live 12/02/2016   Past Medical History:  Diagnosis Date   Allergy    Anal fissure 2005   Chickenpox    H/O syncope    History of EKG 01/20/2017   normal   History of UTI    Hypertension 12/2016   Internal hemorrhoids    follow by  Dr. Hilarie Fredrickson   Kidney stone 2018   Incidental finding on left kidney ultrasound 2018.   Osteoporosis 2019   dexa   Situational stress 12/17/2019   Stone in kidney 01/01/2017   Formatting of this note might be different from the original. 3 mm stone in left kidney seen on U/S of kidneys in both Dec 2018 and again in March 2020   Vitamin D deficiency 2018   23   Allergies  Allergen Reactions   Penicillins Rash   Past Surgical History:  Procedure Laterality Date   ANAL FISTULOTOMY  2006   COLONOSCOPY  07/02/2016   Normal, 10 year follow-up. Dr. Hilarie Fredrickson   Family History  Problem Relation Age of Onset   Prostate cancer Father  Diverticulitis Father        part of colon removed   Hypertension Father    Hypertension Brother    Other Maternal Grandmother        died at 71   Brain cancer Maternal Grandfather    Diverticulitis Paternal Grandmother    Diabetes Mellitus I Daughter    Colon cancer Neg Hx    Stomach cancer Neg Hx    Rectal cancer Neg Hx    Esophageal cancer Neg Hx    Liver cancer Neg Hx    Colon polyps Neg Hx    Social History   Social History Narrative   Single. Has 2 children. Works as a Pharmacist, hospital.   MBA degree.   Wears a bicycle helmet. Wears his seatbelt.   Exercises routinely.    Allergies as of 07/10/2021       Reactions    Penicillins Rash        Medication List        Accurate as of July 10, 2021  8:22 AM. If you have any questions, ask your nurse or doctor.          STOP taking these medications    zoledronic acid 5 MG/100ML Soln injection Commonly known as: RECLAST Stopped by: Howard Pouch, DO       TAKE these medications    lisinopril 5 MG tablet Commonly known as: ZESTRIL Take 1 tablet (5 mg total) by mouth daily.   vitamin D3 50 MCG (2000 UT) Caps Take by mouth.       All past medical history, surgical history, allergies, family history, immunizations andmedications were updated in the EMR today and reviewed under the history and medication portions of their EMR.     No results found for this or any previous visit (from the past 2160 hour(s)).  DG Bone Density Result Date: 09/28/2019  Assessment: Patient has OSTEOPOROSIS   ROS: 14 pt review of systems performed and negative (unless mentioned in an HPI)  Objective: BP 104/64   Pulse 60   Temp 97.6 F (36.4 C) (Oral)   Ht 5\' 10"  (1.778 m)   Wt 182 lb (82.6 kg)   SpO2 96%   BMI 26.11 kg/m  Physical Exam Constitutional:      General: He is not in acute distress.    Appearance: Normal appearance. He is not ill-appearing or toxic-appearing.  HENT:     Head: Normocephalic and atraumatic.  Eyes:     Extraocular Movements: Extraocular movements intact.     Conjunctiva/sclera: Conjunctivae normal.     Pupils: Pupils are equal, round, and reactive to light.  Cardiovascular:     Rate and Rhythm: Normal rate and regular rhythm.     Heart sounds: No murmur heard. Pulmonary:     Effort: Pulmonary effort is normal. No respiratory distress.     Breath sounds: Normal breath sounds. No wheezing, rhonchi or rales.  Musculoskeletal:     Right lower leg: No edema.     Left lower leg: No edema.  Skin:    Coloration: Skin is not jaundiced.     Findings: No rash.  Neurological:     General: No focal deficit present.     Mental  Status: He is alert and oriented to person, place, and time. Mental status is at baseline.     Cranial Nerves: No cranial nerve deficit.     Motor: No weakness.     Gait: Gait normal.  Psychiatric:  Mood and Affect: Mood normal.        Behavior: Behavior normal.        Thought Content: Thought content normal.        Judgment: Judgment normal.     No results found.  Assessment/plan: Alejandro English is a 56 y.o. male present for  Essential hypertension Stable.  Continue lisinopril 5 mg daily - dicussed benefits vs risk with ASA 81 mg.  - diet and routine exercise recommended.  - labs UTD-[facility]- reviewed - Follow-up 5.5 months  Osteoporosis /renal Phosphaturia/hypophosphatemia/vitamin D deficiency Discussion today with patient surrounding his osteoporosis and the ongoing attempt at a diagnosis of cause.  He has now been to two endocrinologist and nephrologist without identifiable cause.   He has received reclast tx   No follow-ups on file.  No orders of the defined types were placed in this encounter.   No orders of the defined types were placed in this encounter.   No orders of the defined types were placed in this encounter.   Referral Orders  No referral(s) requested today     Note is dictated utilizing voice recognition software. Although note has been proof read prior to signing, occasional typographical errors still can be missed. If any questions arise, please do not hesitate to call for verification.  Electronically signed by: Howard Pouch, DO [place]

## 2021-09-23 ENCOUNTER — Ambulatory Visit (INDEPENDENT_AMBULATORY_CARE_PROVIDER_SITE_OTHER): Payer: 59 | Admitting: Family Medicine

## 2021-09-23 ENCOUNTER — Encounter: Payer: Self-pay | Admitting: Family Medicine

## 2021-09-23 VITALS — BP 124/71 | HR 67 | Temp 98.3°F | Ht 70.0 in | Wt 182.0 lb

## 2021-09-23 DIAGNOSIS — U099 Post covid-19 condition, unspecified: Secondary | ICD-10-CM

## 2021-09-23 DIAGNOSIS — R053 Chronic cough: Secondary | ICD-10-CM

## 2021-09-23 HISTORY — DX: Post covid-19 condition, unspecified: U09.9

## 2021-09-23 HISTORY — DX: Chronic cough: R05.3

## 2021-09-23 NOTE — Patient Instructions (Signed)
Keep baby aspirin for 4 weeks to be safe. Mucinex DM will help with cough. Flonase nasal spray will help with allergies and ear drainage

## 2021-09-23 NOTE — Progress Notes (Signed)
Alejandro English , Apr 16, 1965, 56 y.o., male MRN: [id number] Patient Care Team    Relationship Specialty Notifications Start End  Natalia Leatherwood, DO PCP - General Family Medicine  12/02/16   Pyrtle, Carie Caddy, MD Consulting Physician Gastroenterology  06/20/20   Shamleffer, Konrad Dolores, MD Consulting Physician Endocrinology  06/20/20   Drema Halon, MD (Inactive) Consulting Physician Otolaryngology  06/20/20   Hassell Done, MD Referring Physician Endocrinology  06/20/20   Ovidio Kin, MD Attending Physician Nephrology  06/20/20    Comment: Erma Heritage, MD  Delsa Grana, MD Referring Physician Dermatology  12/05/20     Chief Complaint  Patient presents with   Covid Positive    Pt c/o continued cough post covid 08/02; pt states sx are now beginning to improve     Subjective: Pt presents for an OV with complaints of recent COVID illness now approximately 3 weeks out from positive test.  He states he has had a continued cough that was dry.  Even up to last week he felt his cough was progressing.  Today he states he is feeling better and he has noticed his cough has come down a great deal.  He denies any fever, chills, nausea, vomit or shortness of breath.  He is taking a baby aspirin for prophylaxis since he is traveling.     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   Wears a bicycle helmet. Wears his seatbelt.   Exercises routinely.   Past Medical History:  Diagnosis Date   Allergy    Anal fissure 2005   Chickenpox    H/O syncope    History of EKG 01/20/2017   normal   History of UTI    Hypertension 12/2016   Internal  hemorrhoids    follow by  Dr. Rhea Belton   Kidney stone 2018   Incidental finding on left kidney ultrasound 2018.   Osteoporosis 2019   dexa   Situational stress 12/17/2019   Stone in kidney 01/01/2017   Formatting of this note might be different from the original. 3 mm stone in left kidney seen on U/S of kidneys in both Dec 2018 and again in March 2020   Vitamin D deficiency 2018   23   Past Surgical History:  Procedure Laterality Date   ANAL FISTULOTOMY  2006   COLONOSCOPY  07/02/2016   Normal, 10 year follow-up. Dr. Rhea Belton   Family History  Problem Relation Age of Onset   Prostate cancer Father    Diverticulitis Father        part of colon removed   Hypertension Father    Hypertension Brother    Other Maternal Grandmother        died at 33   Brain cancer Maternal Grandfather    Diverticulitis Paternal Grandmother    Diabetes Mellitus I Daughter    Colon cancer Neg Hx    Stomach cancer Neg Hx    Rectal cancer Neg Hx    Esophageal cancer  Neg Hx    Liver cancer Neg Hx    Colon polyps Neg Hx    Allergies as of 09/23/2021       Reactions   Penicillins Rash        Medication List        Accurate as of September 23, 2021 10:05 AM. If you have any questions, ask your nurse or doctor.          aspirin EC 81 MG tablet Take 81 mg by mouth daily. Swallow whole.   lisinopril 5 MG tablet Commonly known as: ZESTRIL Take 1 tablet (5 mg total) by mouth daily.   vitamin D3 50 MCG (2000 UT) Caps Take by mouth.        All past medical history, surgical history, allergies, family history, immunizations andmedications were updated in the EMR today and reviewed under the history and medication portions of their EMR.     ROS Negative, with the exception of above mentioned in HPI   Objective:  BP 124/71   Pulse 67   Temp 98.3 F (36.8 C) (Oral)   Ht 5\' 10"  (1.778 m)   Wt 182 lb (82.6 kg)   SpO2 100%   BMI 26.11 kg/m  Body mass index is 26.11 kg/m. Physical  Exam Vitals and nursing note reviewed.  Constitutional:      General: He is not in acute distress.    Appearance: Normal appearance. He is not ill-appearing, toxic-appearing or diaphoretic.  HENT:     Head: Normocephalic and atraumatic.     Right Ear: Tympanic membrane and ear canal normal.     Left Ear: Tympanic membrane and ear canal normal.     Mouth/Throat:     Pharynx: No oropharyngeal exudate or posterior oropharyngeal erythema.  Eyes:     General: No scleral icterus.       Right eye: No discharge.        Left eye: No discharge.     Extraocular Movements: Extraocular movements intact.     Pupils: Pupils are equal, round, and reactive to light.  Cardiovascular:     Rate and Rhythm: Normal rate and regular rhythm.     Heart sounds: No murmur heard. Pulmonary:     Effort: Pulmonary effort is normal.     Breath sounds: Normal breath sounds. No wheezing, rhonchi or rales.  Musculoskeletal:     Right lower leg: No edema.     Left lower leg: No edema.  Skin:    General: Skin is warm and dry.     Coloration: Skin is not jaundiced or pale.     Findings: No rash.  Neurological:     Mental Status: He is alert and oriented to person, place, and time. Mental status is at baseline.  Psychiatric:        Mood and Affect: Mood normal.        Behavior: Behavior normal.        Thought Content: Thought content normal.        Judgment: Judgment normal.     No results found. No results found. No results found for this or any previous visit (from the past 24 hour(s)).  Assessment/Plan: Alejandro English is a 56 y.o. 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 We discussed that COVID cough can last up to 6 to 8 weeks, longer and some. He does see some  improvement in his lung exam is normal today. Since he is traveling he may benefit from staying on the baby aspirin for the  next couple weeks. We discussed booster for COVID-19.  He should wait at least 3-4 months after COVID illness and then consider having the booster done this fall. Follow-up as needed  Reviewed expectations re: course of current medical issues. Discussed self-management of symptoms. Outlined signs and symptoms indicating need for more acute intervention. Patient verbalized understanding and all questions were answered. Patient received an After-Visit Summary.    No orders of the defined types were placed in this encounter.  No orders of the defined types were placed in this encounter.  Referral Orders  No referral(s) requested today     Note is dictated utilizing voice recognition software. Although note has been proof read prior to signing, occasional typographical errors still can be missed. If any questions arise, please do not hesitate to call for verification.   electronically signed by:  Felix Pacini, DO  [facility] [place]

## 2021-12-23 ENCOUNTER — Other Ambulatory Visit (HOSPITAL_BASED_OUTPATIENT_CLINIC_OR_DEPARTMENT_OTHER): Payer: Self-pay | Admitting: Endocrinology

## 2021-12-23 DIAGNOSIS — M810 Age-related osteoporosis without current pathological fracture: Secondary | ICD-10-CM

## 2021-12-28 ENCOUNTER — Encounter: Payer: Self-pay | Admitting: Family Medicine

## 2021-12-28 ENCOUNTER — Ambulatory Visit: Payer: 59 | Admitting: Family Medicine

## 2021-12-28 VITALS — BP 134/81 | HR 52 | Temp 98.0°F | Wt 180.2 lb

## 2021-12-28 DIAGNOSIS — J3489 Other specified disorders of nose and nasal sinuses: Secondary | ICD-10-CM | POA: Diagnosis not present

## 2021-12-28 DIAGNOSIS — I10 Essential (primary) hypertension: Secondary | ICD-10-CM | POA: Diagnosis not present

## 2021-12-28 DIAGNOSIS — R040 Epistaxis: Secondary | ICD-10-CM | POA: Diagnosis not present

## 2021-12-28 NOTE — Patient Instructions (Addendum)
Return in about 24 weeks (around 06/14/2022) for Routine chronic condition follow-up.        Great to see you today.  I have refilled the medication(s) we provide.   If labs were collected, we will inform you of lab results once received either by echart message or telephone call.   - echart message- for normal results that have been seen by the patient already.   - telephone call: abnormal results or if patient has not viewed results in their echart.

## 2021-12-28 NOTE — Progress Notes (Signed)
Patient ID: Alejandro English, male  DOB: [birth year], 56 y.o.   MRN: [id number] Patient Care Team    Relationship Specialty Notifications Start End  Natalia Leatherwood, DO PCP - General Family Medicine  12/02/16   Pyrtle, Carie Caddy, MD Consulting Physician Gastroenterology  06/20/20   Shamleffer, Konrad Dolores, MD Consulting Physician Endocrinology  06/20/20   Drema Halon, MD (Inactive) Consulting Physician Otolaryngology  06/20/20   Hassell Done, MD Referring Physician Endocrinology  06/20/20   Ovidio Kin, MD Attending Physician Nephrology  06/20/20    Comment: Erma Heritage, MD  Reche Dixon Tandy Gaw, MD Referring Physician Dermatology  12/05/20     Chief Complaint  Patient presents with   Hypertension    Subjective: Alejandro English is a 56 y.o. male present for cmc All past medical history, surgical history, allergies, family history, immunizations, medications and social history were updated in the electronic medical record today. All recent labs, ED visits and hospitalizations within the last year were reviewed.  Hypertension:  Pt reports compliance  with lisinopril 5 mg.  Blood pressures ranges at home within normal limits.Patient denies chest pain, shortness of breath, dizziness or lower extremity edema.  Pt is taking a daily baby ASA and has questions on need.  Pt is not prescribed statin.  Diet/exercise: Low-sodium, exercises routinely. RF: Hypertension       12/05/2020    3:22 PM 01/08/2020    1:10 PM 06/06/2017    3:24 PM 12/02/2016    9:35 AM  Depression screen PHQ 2/9  Decreased Interest 0 0 0 0  Down, Depressed, Hopeless 0 0 0 0  PHQ - 2 Score 0 0 0 0       No data to display                12/02/2016    9:35 AM  Fall Risk   Falls in the past year? No   Immunization History  Administered Date(s) Administered   Influenza Split 11/12/2019   Influenza,inj,Quad PF,6+ Mos 12/02/2016, 12/05/2017, 10/20/2018, 11/12/2019, 11/11/2020, 12/02/2021    Influenza,inj,quad, With Preservative 11/01/2017   Influenza-Unspecified 12/02/2016, 12/05/2017, 10/20/2018, 11/12/2019   Moderna Sars-Covid-2 Vaccination 04/26/2019, 05/24/2019, 12/28/2019   PFIZER Comirnaty(Gray Top)Covid-19 Tri-Sucrose Vaccine 05/08/2020   PFIZER(Purple Top)SARS-COV-2 Vaccination 05/08/2020   Tdap 06/19/2018   Unspecified SARS-COV-2 Vaccination 05/08/2020   Zoster Recombinat (Shingrix) 12/03/2016, 02/07/2017   Zoster, Live 12/02/2016   Past Medical History:  Diagnosis Date   Allergy    Anal fissure 2005   Chickenpox    H/O syncope    History of EKG 01/20/2017   normal   History of UTI    Hypertension 12/2016   Internal hemorrhoids    follow by  Dr. Rhea Belton   Kidney stone 2018   Incidental finding on left kidney ultrasound 2018.   Osteoporosis 2019   dexa   Situational stress 12/17/2019   Stone in kidney 01/01/2017   Formatting of this note might be different from the original. 3 mm stone in left kidney seen on U/S of kidneys in both Dec 2018 and again in March 2020   Vitamin D deficiency 2018   23   Allergies  Allergen Reactions   Penicillins Rash   Past Surgical History:  Procedure Laterality Date   ANAL FISTULOTOMY  2006   COLONOSCOPY  07/02/2016   Normal, 10 year follow-up. Dr. Rhea Belton   Family History  Problem Relation Age of Onset   Prostate cancer Father  Diverticulitis Father        part of colon removed   Hypertension Father    Hypertension Brother    Other Maternal Grandmother        died at 4   Brain cancer Maternal Grandfather    Diverticulitis Paternal Grandmother    Diabetes Mellitus I Daughter    Colon cancer Neg Hx    Stomach cancer Neg Hx    Rectal cancer Neg Hx    Esophageal cancer Neg Hx    Liver cancer Neg Hx    Colon polyps Neg Hx    Social History   Social History Narrative   Single. Has 2 children. Works as a Gaffer.   MBA degree.   Wears a bicycle helmet. Wears his seatbelt.   Exercises routinely.     Allergies as of 12/28/2021       Reactions   Penicillins Rash        Medication List        Accurate as of December 28, 2021  4:13 PM. If you have any questions, ask your nurse or doctor.          STOP taking these medications    aspirin EC 81 MG tablet Stopped by: Felix Pacini, DO       TAKE these medications    lisinopril 5 MG tablet Commonly known as: ZESTRIL Take 1 tablet (5 mg total) by mouth daily.   vitamin D3 50 MCG (2000 UT) Caps Take by mouth.   zoledronic acid 5 MG/100ML Soln injection Commonly known as: RECLAST Inject 5 mg into the vein. yearly       All past medical history, surgical history, allergies, family history, immunizations andmedications were updated in the EMR today and reviewed under the history and medication portions of their EMR.     No results found for this or any previous visit (from the past 2160 hour(s)).  DG Bone Density Result Date: 09/28/2019  Assessment: Patient has OSTEOPOROSIS   ROS: 14 pt review of systems performed and negative (unless mentioned in an HPI)  Objective: BP 134/81   Pulse (!) 52   Temp 98 F (36.7 C)   Wt 180 lb 3.2 oz (81.7 kg)   SpO2 99%   BMI 25.86 kg/m  Physical Exam Constitutional:      General: He is not in acute distress.    Appearance: Normal appearance. He is not ill-appearing or toxic-appearing.  HENT:     Head: Normocephalic and atraumatic.     Nose:     Comments: Right intranasal lesion Eyes:     Extraocular Movements: Extraocular movements intact.     Conjunctiva/sclera: Conjunctivae normal.     Pupils: Pupils are equal, round, and reactive to light.  Cardiovascular:     Rate and Rhythm: Normal rate and regular rhythm.     Heart sounds: No murmur heard. Pulmonary:     Effort: Pulmonary effort is normal. No respiratory distress.     Breath sounds: Normal breath sounds. No wheezing, rhonchi or rales.  Musculoskeletal:     Right lower leg: No edema.     Left lower  leg: No edema.  Skin:    Coloration: Skin is not jaundiced.     Findings: No rash.  Neurological:     General: No focal deficit present.     Mental Status: He is alert and oriented to person, place, and time. Mental status is at baseline.     Cranial Nerves: No cranial  nerve deficit.     Motor: No weakness.     Gait: Gait normal.  Psychiatric:        Mood and Affect: Mood normal.        Behavior: Behavior normal.        Thought Content: Thought content normal.        Judgment: Judgment normal.     No results found.  Assessment/plan: Alejandro English is a 56 y.o. male present for  Essential hypertension Stable Continue  lisinopril 5 mg daily - dicussed benefits vs risk with ASA 81 mg.  - diet and routine exercise recommended.  - labs UTD-[facility]- reviewed - Follow-up 5.5 months  Osteoporosis /renal Phosphaturia/hypophosphatemia/vitamin D deficiency Discussion today with patient surrounding his osteoporosis and the ongoing attempt at a diagnosis of cause.  He has now been to two endocrinologist and nephrologist without identifiable cause.   He has received reclast tx  Nasal lesion/recurrent nose bleeds: Has had recurrent nosebleeds from the right nostril.  He has a physical completed by an [facility] provider who has visualized a nasal lesion present on 2 exams extending over 1 year.  ENT referral had been placed for him from that provider and nothing was noted at that time.  I do visualize a right nasal lesion with mild erythema, scabbed over with dried blood surrounding. Referral to ENT placed today.  Return in about 24 weeks (around 06/14/2022) for Routine chronic condition follow-up.  Orders Placed This Encounter  Procedures   Ambulatory referral to ENT    Orders Placed This Encounter  Procedures   Ambulatory referral to ENT    No orders of the defined types were placed in this encounter.   Referral Orders         Ambulatory referral to ENT       Note is dictated  utilizing voice recognition software. Although note has been proof read prior to signing, occasional typographical errors still can be missed. If any questions arise, please do not hesitate to call for verification.  Electronically signed by: Felix Pacini, DO [facility] [place]

## 2022-01-04 ENCOUNTER — Other Ambulatory Visit (HOSPITAL_BASED_OUTPATIENT_CLINIC_OR_DEPARTMENT_OTHER): Payer: 59

## 2022-01-05 ENCOUNTER — Telehealth: Payer: Self-pay | Admitting: Family Medicine

## 2022-01-05 NOTE — Telephone Encounter (Signed)
Noted  

## 2022-01-05 NOTE — Telephone Encounter (Signed)
Please advise 

## 2022-01-05 NOTE — Telephone Encounter (Signed)
Pt called and stated that he has an appointment scheduled with [facility]. The appointment is scheduled for 01/20/22 at 8:15. He was told to call us and have his referral and office notes sent over to them being as they did not receive it.

## 2022-01-06 ENCOUNTER — Encounter: Payer: Self-pay | Admitting: Family Medicine

## 2022-01-06 NOTE — Telephone Encounter (Signed)
Please advise 

## 2022-01-09 ENCOUNTER — Other Ambulatory Visit: Payer: Self-pay | Admitting: Family Medicine

## 2022-01-12 ENCOUNTER — Encounter: Payer: Self-pay | Admitting: Family Medicine

## 2022-01-13 NOTE — Telephone Encounter (Signed)
FYI

## 2022-01-19 ENCOUNTER — Encounter: Payer: Self-pay | Admitting: Family Medicine

## 2022-01-20 ENCOUNTER — Encounter: Payer: Self-pay | Admitting: Family Medicine

## 2022-01-20 MED ORDER — LISINOPRIL 5 MG PO TABS: 5.0000 mg | ORAL_TABLET | Freq: Every day | ORAL | 1 refills | Status: DC

## 2022-01-20 NOTE — Telephone Encounter (Signed)
FYI

## 2022-06-15 ENCOUNTER — Ambulatory Visit: Payer: 59 | Admitting: Family Medicine

## 2022-06-15 VITALS — BP 123/70 | HR 59 | Temp 98.0°F | Wt 183.4 lb

## 2022-06-15 DIAGNOSIS — I10 Essential (primary) hypertension: Secondary | ICD-10-CM | POA: Diagnosis not present

## 2022-06-15 DIAGNOSIS — E559 Vitamin D deficiency, unspecified: Secondary | ICD-10-CM | POA: Diagnosis not present

## 2022-06-15 DIAGNOSIS — M810 Age-related osteoporosis without current pathological fracture: Secondary | ICD-10-CM | POA: Diagnosis not present

## 2022-06-15 MED ORDER — LISINOPRIL 5 MG PO TABS: 5.0000 mg | ORAL_TABLET | Freq: Every day | ORAL | 1 refills | Status: DC

## 2022-06-15 NOTE — Patient Instructions (Signed)
No follow-ups on file.        Great to see you today.  I have refilled the medication(s) we provide.   If labs were collected, we will inform you of lab results once received either by echart message or telephone call.   - echart message- for normal results that have been seen by the patient already.   - telephone call: abnormal results or if patient has not viewed results in their echart.  

## 2022-06-15 NOTE — Progress Notes (Signed)
Patient ID: Alejandro English, male  DOB: August 24, 1965, 57 y.o.   MRN: [id number] Patient Care Team    Relationship Specialty Notifications Start End  Natalia Leatherwood, DO PCP - General Family Medicine  12/02/16   Pyrtle, Carie Caddy, MD Consulting Physician Gastroenterology  06/20/20   Shamleffer, Konrad Dolores, MD Consulting Physician Endocrinology  06/20/20   Hassell Done, MD Referring Physician Endocrinology  06/20/20   Ovidio Kin, MD Attending Physician Nephrology  06/20/20    Comment: Erma Heritage, MD  Delsa Grana, MD Referring Physician Dermatology  12/05/20   Nolene Ebbs, MD Referring Physician Otolaryngology  06/15/22    Comment: [facility]    Chief Complaint  Patient presents with   Hypertension    CMC    Subjective: Alejandro English is a 57 y.o. male present for cmc All past medical history, surgical history, allergies, family history, immunizations, medications and social history were updated in the electronic medical record today. All recent labs, ED visits and hospitalizations within the last year were reviewed.  Hypertension:  Pt reports compliance with lisinopril 5 mg.  Blood pressures ranges at home within normal limits. Patient denies chest pain, shortness of breath, dizziness or lower extremity edema.  Pt is taking a daily baby ASA and has questions on need.  Pt is not prescribed statin.  Diet/exercise: Low-sodium, exercises routinely. RF: Hypertension       12/28/2021    3:38 PM 12/05/2020    3:22 PM 01/08/2020    1:10 PM 06/06/2017    3:24 PM 12/02/2016    9:35 AM  Depression screen PHQ 2/9  Decreased Interest 0 0 0 0 0  Down, Depressed, Hopeless 0 0 0 0 0  PHQ - 2 Score 0 0 0 0 0       No data to display                12/28/2021    3:38 PM 12/02/2016    9:35 AM  Fall Risk   Falls in the past year? 0 No  Number falls in past yr: 0   Injury with Fall? 0   Follow up Falls evaluation completed    Immunization History   Administered Date(s) Administered   Covid-19, Mrna,Vaccine(Spikevax)34yrs and older 04/16/2022   Influenza Split 11/12/2019   Influenza,inj,Quad PF,6+ Mos 12/02/2016, 12/05/2017, 10/20/2018, 11/12/2019, 11/11/2020, 12/02/2021   Influenza,inj,quad, With Preservative 11/01/2017   Influenza-Unspecified 12/02/2016, 12/05/2017, 10/20/2018, 11/12/2019   Moderna Sars-Covid-2 Vaccination 04/26/2019, 05/24/2019, 12/28/2019   PFIZER Comirnaty(Gray Top)Covid-19 Tri-Sucrose Vaccine 05/08/2020   PFIZER(Purple Top)SARS-COV-2 Vaccination 05/08/2020   Tdap 06/19/2018   Unspecified SARS-COV-2 Vaccination 05/08/2020   Zoster Recombinat (Shingrix) 12/03/2016, 02/07/2017   Zoster, Live 12/02/2016   Past Medical History:  Diagnosis Date   Allergy    Anal fissure 2005   Chickenpox    H/O syncope    History of EKG 01/20/2017   normal   History of UTI    Hypertension 12/2016   Internal hemorrhoids    follow by  Dr. Rhea Belton   Kidney stone 2018   Incidental finding on left kidney ultrasound 2018.   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 3 mm stone in left kidney seen on U/S of kidneys in both Dec 2018 and again in March 2020  Vitamin D deficiency 2018   23   Allergies  Allergen Reactions   Penicillins Rash   Past Surgical History:  Procedure Laterality Date   ANAL FISTULOTOMY  2006   COLONOSCOPY  07/02/2016   Normal, 10 year follow-up. Dr. 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   MBA degree.   Wears a bicycle helmet. Wears his seatbelt.   Exercises routinely.    Allergies as of 06/15/2022       Reactions   Penicillins Rash        Medication List        Accurate as of Jun 15, 2022 11:01 AM. If you have any questions, ask your nurse or doctor.          lisinopril 5 MG tablet Commonly known as: ZESTRIL Take 1 tablet (5 mg total) by mouth daily.   terbinafine 250 MG tablet Commonly known as: LAMISIL Take 250 mg by mouth daily.   vitamin D3 50 MCG (2000 UT) Caps Take by mouth.   Xdemvy 0.25 % Soln Generic drug: Lotilaner Apply to eye.   zoledronic acid 5 MG/100ML Soln injection Commonly known as: RECLAST Inject 5 mg into the vein. yearly       All past medical history, surgical history, allergies, family history, immunizations andmedications were updated in the EMR today and reviewed under the history and medication portions of their EMR.     No results found for this or any previous visit (from the past 2160 hour(s)).  DG Bone Density Result Date: 09/28/2019  Assessment: Patient has OSTEOPOROSIS   ROS: 14 pt review of systems performed and negative (unless mentioned in an HPI)  Objective: BP 123/70   Pulse (!) 59   Temp 98 F (36.7 C)   Wt 183 lb 6.4 oz (83.2 kg)   SpO2 99%   BMI 26.32 kg/m  Physical Exam Constitutional:      General: He is not in acute distress.    Appearance: Normal appearance. He is not ill-appearing or toxic-appearing.  HENT:     Head: Normocephalic and atraumatic.  Eyes:     Extraocular Movements: Extraocular movements intact.     Conjunctiva/sclera: Conjunctivae normal.     Pupils: Pupils are equal, round, and reactive to light.  Cardiovascular:     Rate and Rhythm: Normal rate and  regular rhythm.     Heart sounds: No murmur heard. Pulmonary:     Effort: Pulmonary effort is normal. No respiratory distress.     Breath sounds: Normal breath sounds. No wheezing, rhonchi or rales.  Musculoskeletal:     Right lower leg: No edema.     Left lower leg: No edema.  Skin:    Coloration: Skin is  not jaundiced.     Findings: No rash.  Neurological:     General: No focal deficit present.     Mental Status: He is alert and oriented to person, place, and time. Mental status is at baseline.     Cranial Nerves: No cranial nerve deficit.     Motor: No weakness.     Gait: Gait normal.  Psychiatric:        Mood and Affect: Mood normal.        Behavior: Behavior normal.        Thought Content: Thought content normal.        Judgment: Judgment normal.     No results found.  Assessment/plan: DAEVEON CAMILLI is a 57 y.o. male present for Chronic Conditions/illness Management Essential hypertension Stable Continue lisinopril 5 mg daily - dicussed benefits vs risk with ASA 81 mg.  - diet and routine exercise recommended.  - labs UTD-[facility]- reviewed from February 2024 - Follow-up 5.5 months  Osteoporosis /renal Phosphaturia/hypophosphatemia/vitamin D deficiency Discussion today with patient surrounding his osteoporosis and the ongoing attempt at a diagnosis of cause.  He has now been to two endocrinologist and nephrologist without identifiable cause.   He has received reclast tx x2   Return in about 24 weeks (around 11/30/2022) for Routine chronic condition follow-up.  No orders of the defined types were placed in this encounter.   No orders of the defined types were placed in this encounter.   Meds ordered this encounter  Medications   lisinopril (ZESTRIL) 5 MG tablet    Sig: Take 1 tablet (5 mg total) by mouth daily.    Dispense:  90 tablet    Refill:  1    Referral Orders  No referral(s) requested today      Note is dictated utilizing voice recognition  software. Although note has been proof read prior to signing, occasional typographical errors still can be missed. If any questions arise, please do not hesitate to call for verification.  Electronically signed by: Felix Pacini, DO [facility] [place]

## 2022-10-28 ENCOUNTER — Encounter: Payer: Self-pay | Admitting: Family Medicine

## 2022-11-29 LAB — BASIC METABOLIC PANEL
BUN: 17 (ref 4–21)
CO2: 27 — AB (ref 13–22)
Chloride: 106 (ref 99–108)
Creatinine: 1 (ref 0.6–1.3)
Glucose: 94
Potassium: 4.3 meq/L (ref 3.5–5.1)
Sodium: 140 (ref 137–147)

## 2022-11-29 LAB — LIPID PANEL
Cholesterol: 147 (ref 0–200)
LDL Cholesterol: 82
Triglycerides: 82 (ref 40–160)

## 2022-11-29 LAB — COMPREHENSIVE METABOLIC PANEL
Albumin: 4.4 (ref 3.5–5.0)
Calcium: 9 (ref 8.7–10.7)
eGFR: 84

## 2022-11-29 LAB — HEPATIC FUNCTION PANEL
ALT: 18 U/L (ref 10–40)
AST: 21 (ref 14–40)
Alkaline Phosphatase: 58 (ref 25–125)
Bilirubin, Total: 0.8

## 2022-11-29 LAB — PSA: PSA: 1.83

## 2022-11-29 LAB — TSH: TSH: 0.98 (ref 0.41–5.90)

## 2022-11-30 ENCOUNTER — Ambulatory Visit: Payer: 59 | Admitting: Family Medicine

## 2022-12-08 ENCOUNTER — Ambulatory Visit: Payer: 59 | Admitting: Family Medicine

## 2022-12-08 ENCOUNTER — Encounter: Payer: Self-pay | Admitting: Family Medicine

## 2022-12-08 VITALS — BP 112/62 | HR 58 | Temp 98.2°F | Wt 183.8 lb

## 2022-12-08 DIAGNOSIS — E559 Vitamin D deficiency, unspecified: Secondary | ICD-10-CM

## 2022-12-08 DIAGNOSIS — M818 Other osteoporosis without current pathological fracture: Secondary | ICD-10-CM | POA: Diagnosis not present

## 2022-12-08 DIAGNOSIS — I10 Essential (primary) hypertension: Secondary | ICD-10-CM

## 2022-12-08 MED ORDER — LISINOPRIL 5 MG PO TABS: 5.0000 mg | ORAL_TABLET | Freq: Every day | ORAL | 1 refills | Status: DC

## 2022-12-08 NOTE — Progress Notes (Signed)
Patient ID: Alejandro English, male  DOB: 13-Jul-1965, 57 y.o.   MRN: [id number] Patient Care Team    Relationship Specialty Notifications Start End  Natalia Leatherwood, DO PCP - General Family Medicine  12/02/16   Pyrtle, Carie Caddy, MD Consulting Physician Gastroenterology  06/20/20   Shamleffer, Konrad Dolores, MD Consulting Physician Endocrinology  06/20/20   Hassell Done, MD Referring Physician Endocrinology  06/20/20   Ovidio Kin, MD Attending Physician Nephrology  06/20/20    Comment: Erma Heritage, MD  Delsa Grana, MD Referring Physician Dermatology  12/05/20   Nolene Ebbs, MD Referring Physician Otolaryngology  06/15/22    Comment: [facility]    Chief Complaint  Patient presents with   Hypertension    Subjective: Alejandro English is a 57 y.o. male present for cmc All past medical history, surgical history, allergies, family history, immunizations, medications and social history were updated in the electronic medical record today. All recent labs, ED visits and hospitalizations within the last year were reviewed.  Hypertension:  Pt reports compliance with lisinopril 5 mg.  Blood pressures ranges at home within normal limits. Patient denies chest pain, shortness of breath, dizziness or lower extremity edema.  Pt is taking a daily baby ASA and has questions on need.  Pt is not prescribed statin.  Diet/exercise: Low-sodium, exercises routinely. RF: Hypertension       12/28/2021    3:38 PM 12/05/2020    3:22 PM 01/08/2020    1:10 PM 06/06/2017    3:24 PM 12/02/2016    9:35 AM  Depression screen PHQ 2/9  Decreased Interest 0 0 0 0 0  Down, Depressed, Hopeless 0 0 0 0 0  PHQ - 2 Score 0 0 0 0 0       No data to display                12/28/2021    3:38 PM 12/02/2016    9:35 AM  Fall Risk   Falls in the past year? 0 No  Number falls in past yr: 0   Injury with Fall? 0   Follow up Falls evaluation completed    Immunization History  Administered  Date(s) Administered   Influenza Split 11/12/2019   Influenza, Mdck, Trivalent,PF 6+ MOS(egg free) 12/03/2022   Influenza,inj,Quad PF,6+ Mos 12/02/2016, 12/05/2017, 10/20/2018, 11/12/2019, 11/11/2020, 12/02/2021   Influenza,inj,quad, With Preservative 11/01/2017   Influenza-Unspecified 12/02/2016, 12/05/2017, 10/20/2018, 11/12/2019   Moderna Covid-19 Fall Seasonal Vaccine 56yrs & older 04/16/2022   Moderna Sars-Covid-2 Vaccination 04/26/2019, 05/24/2019, 12/28/2019, 05/08/2020   PFIZER Comirnaty(Gray Top)Covid-19 Tri-Sucrose Vaccine 05/08/2020   PFIZER(Purple Top)SARS-COV-2 Vaccination 05/08/2020   Pfizer Covid-19 Vaccine Bivalent Booster 61yrs & up 12/13/2020   Pfizer(Comirnaty)Fall Seasonal Vaccine 12 years and older 12/03/2022   Tdap 06/19/2018   Unspecified SARS-COV-2 Vaccination 05/08/2020   Zoster Recombinant(Shingrix) 12/03/2016, 02/07/2017   Zoster, Live 12/02/2016   Past Medical History:  Diagnosis Date   Allergy    Anal fissure 2005   Chickenpox    H/O syncope    History of EKG 01/20/2017   normal   History of UTI    Hypertension 12/2016   Internal hemorrhoids    follow by  Dr. Rhea Belton   Kidney stone 2018   Incidental finding on left kidney ultrasound 2018.   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 3 mm stone in left kidney seen on U/S of kidneys in both Dec 2018 and again in March 2020   Vitamin D deficiency 2018   23   Allergies  Allergen Reactions   Penicillins Rash   Past Surgical History:  Procedure Laterality Date   ANAL FISTULOTOMY  2006   COLONOSCOPY  07/02/2016   Normal, 10 year follow-up. Dr. 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   MBA degree.   Wears a bicycle helmet. Wears his seatbelt.   Exercises routinely.    Allergies as of 12/08/2022       Reactions   Penicillins Rash        Medication List        Accurate as of December 08, 2022 10:16 AM. If you have any questions, ask your nurse or doctor.          STOP taking these medications    terbinafine 250 MG tablet Commonly known as: LAMISIL Stopped by: Felix Pacini   Xdemvy 0.25 % Soln Generic drug: Lotilaner Stopped by: Felix Pacini       TAKE these medications    lisinopril 5 MG tablet Commonly known as: ZESTRIL Take 1 tablet (5 mg total) by mouth daily.   vitamin D3 50 MCG (2000 UT) Caps Take by mouth.   zoledronic acid 5 MG/100ML Soln injection Commonly known as: RECLAST Inject 5 mg into the vein. yearly       All past medical history, surgical history, allergies, family history, immunizations andmedications were updated in the EMR today and reviewed under the history and medication portions of their EMR.     Recent Results (from the past 2160 hour(s))  Basic metabolic panel     Status: Abnormal   Collection Time: 11/29/22 12:00 AM  Result Value Ref Range   Glucose 94    BUN 17 4 - 21   CO2 27 (A) 13 - 22   Creatinine 1.0 0.6 - 1.3   Potassium 4.3 3.5 - 5.1 mEq/L   Sodium 140 137 - 147   Chloride 106 99 - 108  Comprehensive metabolic panel     Status: None   Collection Time: 11/29/22 12:00 AM  Result Value Ref Range   eGFR 84    Calcium 9.0 8.7 - 10.7   Albumin 4.4 3.5 - 5.0  Lipid panel     Status: None    Collection Time: 11/29/22 12:00 AM  Result Value Ref Range   Triglycerides 82 40 - 160   Cholesterol 147 0 - 200   LDL Cholesterol 82   Hepatic function panel     Status: None   Collection Time: 11/29/22 12:00 AM  Result Value Ref Range   Alkaline Phosphatase 58 25 - 125   ALT 18 10 - 40 U/L   AST 21 14 - 40   Bilirubin, Total 0.8   PSA     Status: None   Collection Time: 11/29/22 12:00 AM  Result Value Ref Range   PSA 1.83   TSH     Status: None   Collection Time: 11/29/22 12:00 AM  Result Value Ref Range   TSH 0.98 0.41 - 5.90     ROS: 14 pt review of systems performed and negative (unless mentioned in an HPI)  Objective: BP 112/62   Pulse (!) 58   Temp 98.2 F (36.8 C)   Wt 183 lb 12.8 oz (83.4 kg)   SpO2 99%   BMI 26.37 kg/m  Physical Exam Constitutional:      General: He is not in acute distress.    Appearance: Normal appearance. He is not ill-appearing or toxic-appearing.  HENT:     Head: Normocephalic and atraumatic.  Eyes:     Extraocular Movements: Extraocular movements intact.     Conjunctiva/sclera: Conjunctivae normal.     Pupils: Pupils are equal, round, and reactive to light.  Cardiovascular:     Rate and Rhythm: Normal rate and regular rhythm.     Heart sounds: No murmur heard. Pulmonary:     Effort: Pulmonary effort is normal. No respiratory distress.     Breath sounds: Normal breath sounds. No wheezing, rhonchi or rales.  Musculoskeletal:     Right lower leg: No edema.     Left lower leg: No edema.  Skin:    Coloration: Skin is not jaundiced.     Findings: No rash.  Neurological:     General: No focal deficit present.     Mental Status: He is alert and oriented to person, place, and time. Mental status is at baseline.     Cranial Nerves: No cranial nerve deficit.     Motor: No weakness.     Gait: Gait normal.  Psychiatric:        Mood and Affect: Mood normal.        Behavior: Behavior normal.        Thought Content: Thought content  normal.        Judgment: Judgment normal.    No results found.  Assessment/plan: Alejandro English is a 57 y.o. male present for Chronic Conditions/illness Management Essential hypertension Stable Continue lisinopril 5 mg daily - dicussed benefits vs risk with ASA 81 mg.  - diet and routine exercise recommended.  - labs UTD-[facility]- reviewed from 11/2022  Osteoporosis /renal Phosphaturia/hypophosphatemia/vitamin D deficiency Discussion today with patient surrounding his osteoporosis and the ongoing attempt at a diagnosis of cause.  He has now been to two endocrinologist and nephrologist without identifiable cause.   He has received reclast txs- next in Dec 2025 has been scheduled.   Return in about 24 weeks (around 05/25/2023) for Routine chronic condition follow-up.   Orders Placed This Encounter  Procedures   Basic metabolic panel   Comprehensive metabolic panel   Lipid panel   Hepatic function panel   PSA   TSH    Meds ordered this encounter  Medications   lisinopril (ZESTRIL) 5 MG tablet    Sig: Take 1 tablet (5 mg total) by mouth daily.    Dispense:  90 tablet    Refill:  1    Referral Orders  No referral(s) requested today    Note  is dictated utilizing voice recognition software. Although note has been proof read prior to signing, occasional typographical errors still can be missed. If any questions arise, please do not hesitate to call for verification.  Electronically signed by: Felix Pacini, DO [facility] [place]

## 2022-12-08 NOTE — Patient Instructions (Addendum)

## 2022-12-10 ENCOUNTER — Encounter: Payer: Self-pay | Admitting: Family Medicine

## 2022-12-15 NOTE — Telephone Encounter (Signed)
I was able to see the report of his coronary calcium score, and the report of the potential nodule. Please advise patient to make an appointment to discuss CT, and we can place order for image to occur in about 3 months.  Ideally want to wait a few months to allow changes to occur, if they are going to occur.  If no changes occur, then we are looking for stability in the size of the nodule, and then we know we likely do not have to continue following up on the nodule long term.

## 2022-12-17 NOTE — Telephone Encounter (Signed)
Patient scheduled follow up CT on 03/15/23 with Dr. Claiborne Billings.

## 2023-03-15 ENCOUNTER — Ambulatory Visit: Payer: 59 | Admitting: Family Medicine

## 2023-03-15 ENCOUNTER — Encounter: Payer: Self-pay | Admitting: Family Medicine

## 2023-03-15 VITALS — BP 118/78 | HR 68 | Temp 98.2°F | Wt 182.0 lb

## 2023-03-15 DIAGNOSIS — R931 Abnormal findings on diagnostic imaging of heart and coronary circulation: Secondary | ICD-10-CM | POA: Diagnosis not present

## 2023-03-15 DIAGNOSIS — J9859 Other diseases of mediastinum, not elsewhere classified: Secondary | ICD-10-CM | POA: Diagnosis not present

## 2023-03-15 HISTORY — DX: Other diseases of mediastinum, not elsewhere classified: J98.59

## 2023-03-15 LAB — BASIC METABOLIC PANEL
BUN: 15 mg/dL (ref 6–23)
CO2: 32 meq/L (ref 19–32)
Calcium: 9.4 mg/dL (ref 8.4–10.5)
Chloride: 105 meq/L (ref 96–112)
Creatinine, Ser: 1 mg/dL (ref 0.40–1.50)
GFR: 83.59 mL/min (ref >60.00)
Glucose, Bld: 74 mg/dL (ref 70–99)
Potassium: 4.5 meq/L (ref 3.5–5.1)
Sodium: 142 meq/L (ref 135–145)

## 2023-03-15 NOTE — Patient Instructions (Addendum)
We will call you with results once we receive them.         Great to see you today.  I have refilled the medication(s) we provide.   If labs were collected or images ordered, we will inform you of  results once we have received them and reviewed. We will contact you either by echart message, or telephone call.  Please give ample time to the testing facility, and our office to run,  receive and review results. Please do not call inquiring of results, even if you can see them in your chart. We will contact you as soon as we are able. If it has been over 1 week since the test was completed, and you have not yet heard from [place], then please call us.    - echart message- for normal results that have been seen by the patient already.   - telephone call: abnormal results or if patient has not viewed results in their echart.  If a referral to a specialist was entered for you, please call us in 2 weeks if you have not heard from the specialist office to schedule.

## 2023-03-15 NOTE — Progress Notes (Signed)
Alejandro English , 1965/06/29, 58 y.o., male MRN: [id number] Patient Care Team    Relationship Specialty Notifications Start End  Natalia Leatherwood, DO PCP - General Family Medicine  12/02/16   Pyrtle, Carie Caddy, MD Consulting Physician Gastroenterology  06/20/20   Shamleffer, Konrad Dolores, MD Consulting Physician Endocrinology  06/20/20   Hassell Done, MD Referring Physician Endocrinology  06/20/20   Ovidio Kin, MD Attending Physician Nephrology  06/20/20    Comment: Erma Heritage, MD  Delsa Grana, MD Referring Physician Dermatology  12/05/20   Nolene Ebbs, MD Referring Physician Otolaryngology  06/15/22    Comment: [facility]    Chief Complaint  Patient presents with   abnomral CT of chest     Subjective: Alejandro English is a 58 y.o. Pt presents for an OV to follow up on abnormal CT completed 12/2022 in [facility] system, by another provider, with an incidental finding of 6 mm soft tissue nodule in the prevascular mediastinum.  Patient was undergoing a coronary calcium/cardiac CT when finding occurred. Recommendations were repeat CT in 3-6 months.  12/07/2022 CT chest (cardiac) Calcified atherosclerotic disease is evident within the coronary arterial distribution.  The patient's total coronary artery calcium score is 13, which places the patient in the 49 percentile for individuals of matched age, gender and race/ethnicity.  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 Attention on follow-up.       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   Wears a  bicycle helmet. Wears his seatbelt.   Exercises routinely.   Past Medical History:  Diagnosis Date   Allergy    Anal fissure 2005   Chickenpox    H/O syncope    History of EKG 01/20/2017   normal   History of UTI    Hypertension 12/2016   Internal hemorrhoids    follow by  Dr. Rhea Belton   Kidney stone 2018   Incidental finding on left kidney ultrasound 2018.   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 3 mm stone in left kidney seen on U/S of kidneys in both Dec 2018 and again in March 2020   Vitamin D deficiency 2018   23   Past Surgical History:  Procedure Laterality Date   ANAL FISTULOTOMY  2006   COLONOSCOPY  07/02/2016   Normal, 10 year follow-up. Dr. Rhea Belton   Family History  Problem Relation Age of Onset   Prostate cancer Father    Diverticulitis Father        part of colon removed   Hypertension Father    Hypertension Brother    Other Maternal Grandmother        died at 52   Brain cancer Maternal Grandfather  Diverticulitis Paternal Grandmother    Diabetes Mellitus I Daughter    Colon cancer Neg Hx    Stomach cancer Neg Hx    Rectal cancer Neg Hx    Esophageal cancer Neg Hx    Liver cancer Neg Hx    Colon polyps Neg Hx    Allergies as of 03/15/2023       Reactions   Penicillins Rash        Medication List        Accurate as of March 15, 2023 10:53 AM. If you have any questions, ask your nurse or doctor.          lisinopril 5 MG tablet Commonly known as: ZESTRIL Take 1 tablet (5 mg total) by mouth daily.   vitamin D3 50 MCG (2000 UT) Caps Take by mouth.   zoledronic acid 5 MG/100ML Soln injection Commonly known as: RECLAST Inject 5 mg into the vein. yearly        All past medical history, surgical history, allergies, family history, immunizations  andmedications were updated in the EMR today and reviewed under the history and medication portions of their EMR.     ROS Negative, with the exception of above mentioned in HPI   Objective:  BP 118/78   Pulse 68   Temp 98.2 F (36.8 C)   Wt 182 lb (82.6 kg)   SpO2 100%   BMI 26.11 kg/m  Body mass index is 26.11 kg/m. Physical Exam Vitals and nursing note reviewed.  Constitutional:      General: He is not in acute distress.    Appearance: Normal appearance. He is not ill-appearing, toxic-appearing or diaphoretic.  HENT:     Head: Normocephalic and atraumatic.  Eyes:     General: No scleral icterus.       Right eye: No discharge.        Left eye: No discharge.     Extraocular Movements: Extraocular movements intact.     Pupils: Pupils are equal, round, and reactive to light.  Cardiovascular:     Rate and Rhythm: Normal rate and regular rhythm.  Pulmonary:     Effort: Pulmonary effort is normal. No respiratory distress.     Breath sounds: Normal breath sounds. No wheezing, rhonchi or rales.  Musculoskeletal:     Right lower leg: No edema.     Left lower leg: No edema.  Skin:    General: Skin is warm and dry.     Coloration: Skin is not jaundiced or pale.     Findings: No rash.  Neurological:     Mental Status: He is alert and oriented to person, place, and time. Mental status is at baseline.  Psychiatric:        Mood and Affect: Mood normal.        Behavior: Behavior normal.        Thought Content: Thought content normal.        Judgment: Judgment normal.    No results found. No results found. No results found for this or any previous visit (from the past 24 hours).  Assessment/Plan: Alejandro English is a 58 y.o. male present for OV for  [place]at[place] coronary artery calcium 13; 49th percentile (Primary)/Mediastinal mass; 6 mm soft tissue nodule in the prevascular mediastinum - CT chest with contrast ordered for 3 mos f/u on incidental finding via CT in 12/2022 of 6 mm  soft tissue mass of pre-vascular mediastinal area.  Pt is asymptomatic; One results received will  discuss need for repeat imaging vs referral if needed.  Reviewed expectations re: course of current medical issues. Discussed self-management of symptoms. Outlined signs and symptoms indicating need for more acute intervention. Patient verbalized understanding and all questions were answered. Patient received an After-Visit Summary.    No orders of the defined types were placed in this encounter.  No orders of the defined types were placed in this encounter.  Referral Orders  No referral(s) requested today     Note is dictated utilizing voice recognition software. Although note has been proof read prior to signing, occasional typographical errors still can be missed. If any questions arise, please do not hesitate to call for verification.   electronically signed by:  Felix Pacini, DO   [facility] [place]

## 2023-03-16 ENCOUNTER — Encounter: Payer: Self-pay | Admitting: Family Medicine

## 2023-03-31 ENCOUNTER — Telehealth: Payer: Self-pay | Admitting: Family Medicine

## 2023-03-31 ENCOUNTER — Telehealth: Payer: Self-pay

## 2023-03-31 DIAGNOSIS — J9859 Other diseases of mediastinum, not elsewhere classified: Secondary | ICD-10-CM

## 2023-03-31 NOTE — Telephone Encounter (Signed)
 LVM to discuss

## 2023-03-31 NOTE — Telephone Encounter (Signed)
 Please advise  Copied from CRM [id number]. Topic: Clinical - Lab/Test Results >> Mar 31, 2023  9:39 AM Orinda Kenner C wrote: Reason for CRM: Patient wants CT scan results, please call back at [phone]. Patient states cannot message on MyChart , provided MyChart [phone] to have it fix.

## 2023-03-31 NOTE — Telephone Encounter (Signed)
 Patient was called and discussed CT chest with contrast results.  CT chest was repeated in 3 months for incidental finding of small nodule of mediastinum incidentally found on cardiac CT. 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 Previous size 8 x 6 mm, now a 10 x 7 mm.  Report is uploaded in the system from [facility]. Pt will work on Architect on disk. Urgent referral to thoracic surgery placed today.  Pt asked to call us back in 1 week if he has not heard from surgery

## 2023-04-01 NOTE — Telephone Encounter (Signed)
 Spoke with pt

## 2023-04-08 ENCOUNTER — Institutional Professional Consult (permissible substitution): Payer: 59 | Admitting: Thoracic Surgery (Cardiothoracic Vascular Surgery)

## 2023-04-08 ENCOUNTER — Other Ambulatory Visit: Payer: Self-pay | Admitting: Thoracic Surgery (Cardiothoracic Vascular Surgery)

## 2023-04-08 ENCOUNTER — Other Ambulatory Visit: Payer: Self-pay | Admitting: *Deleted

## 2023-04-08 ENCOUNTER — Encounter: Payer: Self-pay | Admitting: Thoracic Surgery (Cardiothoracic Vascular Surgery)

## 2023-04-08 VITALS — BP 172/80 | HR 76 | Resp 18 | Ht 70.0 in | Wt 179.0 lb

## 2023-04-08 DIAGNOSIS — J9859 Other diseases of mediastinum, not elsewhere classified: Secondary | ICD-10-CM

## 2023-04-08 NOTE — Progress Notes (Signed)
 [address]       [place] [address]             [phone]                    CARYL MANAS [facility] Medical Record #[id number] Date of Birth: 02-16-1965  Referring: Natalia Leatherwood, DO Primary Care: Natalia Leatherwood, DO Primary Cardiologist: None  Chief Complaint:    Chief Complaint  Patient presents with   Mediastinal Mass    New patient consultation, Chest CT 2/25- pt to bring disk    History of Present Illness:    Alejandro English 58 y.o. male presents for surgical evaluation of an anterior mediastinal mass that was found incidentally.  This was seen initially on his coronary calcium CT, and then followed up with a dedicated CT scan of the chest.  He denies any neurologic symptoms, or shortness of breath.  He denies any fevers chills or night sweats.    Past Medical History:  Diagnosis Date   Allergy    Anal fissure 2005   Chickenpox    H/O syncope    History of EKG 01/20/2017   normal   History of UTI    Hypertension 12/2016   Internal hemorrhoids    follow by  Dr. Rhea Belton   Kidney stone 2018   Incidental finding on left kidney ultrasound 2018.   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 3 mm stone in left kidney seen on U/S of kidneys in both Dec 2018 and again in March 2020   Vitamin D deficiency 2018   23    Past Surgical History:  Procedure Laterality Date   ANAL FISTULOTOMY  2006   COLONOSCOPY  07/02/2016   Normal, 10 year follow-up. Dr. Rhea Belton    Family History  Problem Relation Age of Onset   Prostate cancer Father    Diverticulitis Father        part of colon removed   Hypertension Father    Hypertension Brother    Other Maternal Grandmother        died at 51   Brain cancer Maternal Grandfather    Diverticulitis Paternal Grandmother    Diabetes  Mellitus I Daughter    Colon cancer Neg Hx    Stomach cancer Neg Hx    Rectal cancer Neg Hx    Esophageal cancer Neg Hx    Liver cancer Neg Hx    Colon polyps Neg Hx      Social History   Tobacco Use  Smoking Status Never  Smokeless Tobacco Never    Social History   Substance and Sexual Activity  Alcohol Use Yes   Comment: has one drink per week     Allergies  Allergen Reactions   Penicillins Rash    Current Outpatient Medications  Medication Sig Dispense Refill   Cholecalciferol (VITAMIN D3) 50 MCG (2000 UT) CAPS Take by mouth.      lisinopril (ZESTRIL) 5 MG tablet Take 1 tablet (5 mg total) by mouth daily. 90 tablet 1   zoledronic acid (RECLAST) 5 MG/100ML SOLN injection Inject 5 mg into the vein. yearly     No current facility-administered medications for this visit.    Review of Systems  Constitutional: Negative.  Respiratory: Negative.    Cardiovascular: Negative.   Neurological: Negative.     PHYSICAL EXAMINATION: BP (!) 172/80 (BP Location: Right Arm)   Pulse 76   Resp 18   Ht 5\' 10"  (1.778 m)   Wt 179 lb (81.2 kg)   SpO2 98% Comment: RA  BMI 25.68 kg/m   Physical Exam Constitutional:      General: He is not in acute distress.    Appearance: He is normal weight. He is not ill-appearing.  HENT:     Head: Normocephalic and atraumatic.  Eyes:     Extraocular Movements: Extraocular movements intact.  Cardiovascular:     Rate and Rhythm: Normal rate.  Pulmonary:     Effort: Pulmonary effort is normal. No respiratory distress.  Abdominal:     General: Abdomen is flat. There is no distension.  Musculoskeletal:        General: Normal range of motion.     Cervical back: Normal range of motion.  Skin:    General: Skin is warm and dry.  Neurological:     General: No focal deficit present.     Mental Status: He is alert and oriented to person, place, and time.      Diagnostic Studies & Laboratory data:     Recent Radiology Findings:   No  results found.     I have independently reviewed the above radiology studies  and reviewed the findings with the patient.   Recent Lab Findings: Lab Results  Component Value Date   WBC 5.1 04/10/2018   HGB 16.0 04/10/2018   HCT 48 01/20/2017   PLT 159 04/10/2018   GLUCOSE 74 03/15/2023   CHOL 147 11/29/2022   TRIG 82 11/29/2022   HDL 43 04/10/2018   LDLCALC 82 11/29/2022   ALT 18 11/29/2022   AST 21 11/29/2022   NA 142 03/15/2023   K 4.5 03/15/2023   CL 105 03/15/2023   CREATININE 1.00 03/15/2023   BUN 15 03/15/2023   CO2 32 03/15/2023   TSH 0.98 11/29/2022   HGBA1C 5.1 04/10/2018    Thoracic inlet/central airways: Thyroid normal. Airway patent.  Mediastinum/hila/axilla: Slight interval growth in the ovoid, homogeneous, low-density prevascular mediastinal soft tissue nodule currently measuring 10 x 7 mm (series 2 image 52), previously measuring 8 x 6 mm with surrounding stranding  Heart/vessels: Normal heart size. No pericardial effusion. Aorta normal in caliber and appearance. No central pulmonary embolism.  Lungs/pleura: No pulmonary parenchymal disease, pleural effusion, or pneumothorax.  Upper abdomen: Unremarkable.  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  Will obtain tumor markers, and repeat imaging via PET CT      Ulmer Degen O Roshanna Cimino 04/08/2023 3:02 PM

## 2023-04-14 LAB — TSH+FREE T4: TSH W/REFLEX TO FT4: 1.57 m[IU]/L (ref 0.40–4.50)

## 2023-04-14 LAB — ALLERGEN SCALLOPS F338
CLASS: 0
Scallop IgE: <0.1 kU/L

## 2023-04-14 LAB — INTERPRETATION:

## 2023-04-14 LAB — LACTATE DEHYDROGENASE: LDH: 121 U/L (ref 120–250)

## 2023-04-14 LAB — AFP TUMOR MARKER: AFP-Tumor Marker: 1.5 ng/mL (ref <6.1)

## 2023-04-14 LAB — ACETYLCHOLINE RECEPTOR, BINDING: A CHR BINDING ABS: <0.3 nmol/L

## 2023-04-29 ENCOUNTER — Encounter: Payer: Self-pay | Admitting: Family Medicine

## 2023-04-29 NOTE — Telephone Encounter (Signed)
 No further action needed.

## 2023-05-03 DIAGNOSIS — M722 Plantar fascial fibromatosis: Secondary | ICD-10-CM | POA: Insufficient documentation

## 2023-05-23 ENCOUNTER — Ambulatory Visit (HOSPITAL_COMMUNITY)
Admission: RE | Admit: 2023-05-23 | Discharge: 2023-05-23 | Disposition: A | Discharge status: Home / Self Care | Source: Ambulatory Visit | Attending: Thoracic Surgery (Cardiothoracic Vascular Surgery) | Admitting: Thoracic Surgery (Cardiothoracic Vascular Surgery)

## 2023-05-23 DIAGNOSIS — J9859 Other diseases of mediastinum, not elsewhere classified: Secondary | ICD-10-CM | POA: Diagnosis present

## 2023-05-23 LAB — GLUCOSE, CAPILLARY: Glucose-Capillary: 106 mg/dL — ABNORMAL HIGH (ref 70–99)

## 2023-05-23 MED ORDER — FLUDEOXYGLUCOSE F - 18 (FDG) INJECTION
8.9200 | Freq: Once | INTRAVENOUS | Status: AC
  Administered 2023-05-23: 8.92 via INTRAVENOUS

## 2023-05-27 ENCOUNTER — Encounter: Payer: Self-pay | Admitting: Family Medicine

## 2023-05-27 ENCOUNTER — Ambulatory Visit: Payer: 59 | Admitting: Family Medicine

## 2023-05-27 VITALS — BP 130/76 | HR 74 | Temp 98.3°F | Wt 179.2 lb

## 2023-05-27 DIAGNOSIS — M818 Other osteoporosis without current pathological fracture: Secondary | ICD-10-CM | POA: Diagnosis not present

## 2023-05-27 DIAGNOSIS — J9859 Other diseases of mediastinum, not elsewhere classified: Secondary | ICD-10-CM | POA: Diagnosis not present

## 2023-05-27 DIAGNOSIS — E559 Vitamin D deficiency, unspecified: Secondary | ICD-10-CM | POA: Diagnosis not present

## 2023-05-27 DIAGNOSIS — I10 Essential (primary) hypertension: Secondary | ICD-10-CM

## 2023-05-27 DIAGNOSIS — R931 Abnormal findings on diagnostic imaging of heart and coronary circulation: Secondary | ICD-10-CM

## 2023-05-27 MED ORDER — LISINOPRIL 5 MG PO TABS: 5.0000 mg | ORAL_TABLET | Freq: Every day | ORAL | 1 refills | Status: DC

## 2023-05-27 NOTE — Progress Notes (Signed)
 Patient ID: Alejandro English, male  DOB: 06-20-1965, 58 y.o.   MRN: [id number] Patient Care Team    Relationship Specialty Notifications Start End  Mariel Shope, DO PCP - General Family Medicine  12/02/16   Pyrtle, Amber Bail, MD Consulting Physician Gastroenterology  06/20/20   Shamleffer, Julian Obey, MD Consulting Physician Endocrinology  06/20/20   Edra Govern, MD Referring Physician Endocrinology  06/20/20   Almyra Jain, MD Attending Physician Nephrology  06/20/20    Comment: Gladystine Lamprey, MD  Michaeline Adolf Laurine Pore, MD Referring Physician Dermatology  12/05/20   Luisa Sails, MD Referring Physician Otolaryngology  06/15/22    Comment: [facility]  Hilarie Lovely, MD Consulting Physician Cardiothoracic Surgery  05/27/23     Chief Complaint  Patient presents with   Hypertension    Subjective: Alejandro English is a 58 y.o. male present for chronic condition management All past medical history, surgical history, allergies, family history, immunizations, medications and social history were updated in the electronic medical record today. All recent labs, ED visits and hospitalizations within the last year were reviewed.  Hypertension:  Pt reports compliance with lisinopril  5 mg.  Blood pressures ranges at home within normal limits. Patient denies chest pain, shortness of breath, dizziness or lower extremity edema.  Pt is taking a daily baby ASA and has questions on need.  Pt is not prescribed statin.  Diet/exercise: Low-sodium, exercises routinely. 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 Est with Dr. Deloise Ferries Prior note Abnormal CT completed 12/2022 in [facility] system, by another provider, with an incidental finding of 6 mm soft tissue nodule in the prevascular mediastinum.  Patient was undergoing a coronary calcium/cardiac CT when finding occurred. Recommendations were repeat CT in 3-6 months.  12/07/2022  CT chest (cardiac) Calcified atherosclerotic disease is evident within the coronary arterial distribution.  The patient's total coronary artery calcium score is 13, which places the patient in the 49 percentile for individuals of matched age, gender and race/ethnicity.  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 Attention on follow-up.       05/27/2023   10:55 AM 03/15/2023   10:54 AM 12/28/2021    3:38 PM 12/05/2020    3:22 PM 01/08/2020    1:10 PM  Depression screen PHQ 2/9  Decreased Interest 0 0 0 0 0  Down, Depressed, Hopeless 0 0 0 0 0  PHQ - 2 Score 0 0 0 0 0  Altered sleeping 2 2     Tired, decreased energy 0 0     Change in appetite 1 0     Feeling bad or failure about yourself  1 0     Trouble concentrating 0 0     Moving slowly or fidgety/restless 0 0     Suicidal thoughts 0 0     PHQ-9 Score 4 2     Difficult doing work/chores Not difficult at all Not difficult at all         05/27/2023   10:55 AM 03/15/2023   10:54 AM  GAD 7 : Generalized Anxiety Score  Nervous, Anxious, on Edge 1 1  Control/stop worrying 0 0  Worry too much - different things 0 0  Trouble relaxing 0 0  Restless 1 0  Easily annoyed or irritable 0 0  Afraid - awful might happen 0 0  Total GAD 7 Score  2 1  Anxiety Difficulty Not difficult at all Not difficult at all          05/27/2023   10:55 AM 03/15/2023   10:54 AM 12/28/2021    3:38 PM 12/02/2016    9:35 AM  Fall Risk   Falls in the past year? 0 0 0 No  Number falls in past yr:   0   Injury with Fall?   0   Follow up Falls evaluation completed Falls evaluation completed Falls evaluation completed    Immunization History  Administered Date(s) Administered   Influenza Split 11/12/2019   Influenza, Mdck, Trivalent,PF 6+ MOS(egg free) 12/03/2022   Influenza,inj,Quad PF,6+ Mos 12/02/2016, 12/05/2017, 10/20/2018, 11/12/2019, 11/11/2020, 12/02/2021   Influenza,inj,quad, With  Preservative 11/01/2017   Influenza-Unspecified 12/02/2016, 12/05/2017, 10/20/2018, 11/12/2019   Moderna Covid-19 Fall Seasonal Vaccine 47yrs & older 04/16/2022   Moderna Sars-Covid-2 Vaccination 04/26/2019, 05/24/2019, 12/28/2019, 05/08/2020   PFIZER Comirnaty(Gray Top)Covid-19 Tri-Sucrose Vaccine 05/08/2020   PFIZER(Purple Top)SARS-COV-2 Vaccination 05/08/2020   Pfizer Covid-19 Vaccine Bivalent Booster 22yrs & up 12/13/2020   Pfizer(Comirnaty)Fall Seasonal Vaccine 12 years and older 12/03/2022   Tdap 06/19/2018   Unspecified SARS-COV-2 Vaccination 05/08/2020   Zoster Recombinant(Shingrix ) 12/03/2016, 02/07/2017   Zoster, Live 12/02/2016   Past Medical History:  Diagnosis Date   Allergy    Anal fissure 2005   Chickenpox    H/O syncope    History of EKG 01/20/2017   normal   History of UTI    Hypertension 12/2016   Internal hemorrhoids    follow by  Dr. Bridgett Camps   Kidney stone 2018   Incidental finding on left kidney ultrasound 2018.   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 3 mm stone in left kidney seen on U/S of kidneys in both Dec 2018 and again in March 2020   Vitamin D  deficiency 2018   23   Allergies  Allergen Reactions   Penicillins Rash   Past Surgical History:  Procedure Laterality Date   ANAL FISTULOTOMY  2006   COLONOSCOPY  07/02/2016   Normal, 10 year follow-up. Dr. Bridgett Camps   Family History  Problem Relation Age of Onset   Prostate cancer Father    Diverticulitis Father        part of colon removed   Hypertension Father    Hypertension Brother    Other Maternal Grandmother        died at 37   Brain cancer Maternal Grandfather    Diverticulitis Paternal Grandmother    Diabetes Mellitus I Daughter    Colon cancer Neg Hx    Stomach cancer Neg Hx    Rectal cancer Neg Hx     Esophageal cancer Neg Hx    Liver cancer Neg Hx    Colon polyps Neg Hx    Social History   Social History Narrative   Single. Has 2 children. Works as a Gaffer.   MBA degree.   Wears a bicycle helmet. Wears his seatbelt.   Exercises routinely.    Allergies as of 05/27/2023       Reactions   Penicillins Rash        Medication List        Accurate as of May 27, 2023 11:15 AM. If you have any questions, ask your nurse or doctor.  lisinopril  5 MG tablet Commonly known as: ZESTRIL  Take 1 tablet (5 mg total) by mouth daily.   vitamin D3 50 MCG (2000 UT) Caps Take by mouth.   zoledronic acid 5 MG/100ML Soln injection Commonly known as: RECLAST Inject 5 mg into the vein. yearly       All past medical history, surgical history, allergies, family history, immunizations andmedications were updated in the EMR today and reviewed under the history and medication portions of their EMR.     Recent Results (from the past 2160 hours)  Basic Metabolic Panel (BMET)     Status: None   Collection Time: 03/15/23 11:05 AM  Result Value Ref Range   Sodium 142 135 - 145 mEq/L   Potassium 4.5 3.5 - 5.1 mEq/L   Chloride 105 96 - 112 mEq/L   CO2 32 19 - 32 mEq/L   Glucose, Bld 74 70 - 99 mg/dL   BUN 15 6 - 23 mg/dL   Creatinine, Ser 7.82 0.40 - 1.50 mg/dL   GFR 95.62 >13.08 mL/min    Comment: Calculated using the CKD-EPI Creatinine Equation (2021)   Calcium 9.4 8.4 - 10.5 mg/dL  Acetylcholine Receptor, Binding     Status: None   Collection Time: 04/08/23  2:42 PM  Result Value Ref Range   A CHR BINDING ABS <0.30 nmol/L    Comment: . Reference Ranges for Acetylcholine Receptor   Binding Antibody: .  Negative: < or =0.30 nmol/L Equivocal:  0.31-0.49 nmol/L  Positive: > or =0.50 nmol/L   Lactate dehydrogenase     Status: None   Collection Time: 04/08/23  2:42 PM  Result Value Ref Range   LDH 121 120 - 250 U/L  TSH + free T4     Status: None    Collection Time: 04/08/23  2:42 PM  Result Value Ref Range   TSH W/REFLEX TO FT4 1.57 0.40 - 4.50 mIU/L  AFP tumor marker     Status: None   Collection Time: 04/08/23  2:42 PM  Result Value Ref Range   AFP-Tumor Marker 1.5 <6.1 ng/mL    Comment: . This test was performed using the Beckman Coulter chemiluminescent method. Values obtained from different assay methods cannot be used interchangeably. AFP levels, regardless of value, should not be interpreted as absolute evidence of the presence or absence of disease. .   Allergen Scallops [phone]     Status: None   Collection Time: 04/08/23  2:42 PM  Result Value Ref Range   Scallop IgE <0.10 kU/L   CLASS 0   Interpretation:     Status: None   Collection Time: 04/08/23  2:42 PM  Result Value Ref Range   Interpretation      Comment: . Specific                        Level of Allergen IGE Class      kU/L             Specific IGE Antibody  -----         ---------        -------------------   0              <0.10           Absent/Undetectable   0/1        0.10-0.34           Very Low Level   1  0.35-0.69           Low Level   2          0.70-3.49           Moderate Level   3          3.50-17.4           High Level   4          17.5-49.9           Very High Level   5            50-100            Very High Level   6              >100            Very High Level . The clinical relevance of allergen results of 0.10-0.34 kU/L are undetermined and intended for  specialist use. . Allergens denoted with a "**" include results using one or more analyte specific reagents. In those cases, the test was developed and its analytical performance characteristics have been determined by [employer]. It has not been cleared or approved by the U.S. Food and Drug Administration. This assay  has been v alidated pursuant to the [employer]  and is used for clinical purposes.   Glucose, capillary     Status: Abnormal   Collection  Time: 05/23/23 10:43 AM  Result Value Ref Range   Glucose-Capillary 106 (H) 70 - 99 mg/dL    Comment: Glucose reference range applies only to samples taken after fasting for at least 8 hours.     ROS: 14 pt review of systems performed and negative (unless mentioned in an HPI)  Objective: BP 130/76   Pulse 74   Temp 98.3 F (36.8 C)   Wt 179 lb 3.2 oz (81.3 kg)   SpO2 99%   BMI 25.71 kg/m  Physical Exam Constitutional:      General: He is not in acute distress.    Appearance: Normal appearance. He is not ill-appearing or toxic-appearing.  HENT:     Head: Normocephalic and atraumatic.  Eyes:     Extraocular Movements: Extraocular movements intact.     Conjunctiva/sclera: Conjunctivae normal.     Pupils: Pupils are equal, round, and reactive to light.  Cardiovascular:     Rate and Rhythm: Normal rate and regular rhythm.     Heart sounds: No murmur heard. Pulmonary:     Effort: Pulmonary effort is normal. No respiratory distress.     Breath sounds: Normal breath sounds. No wheezing, rhonchi or rales.  Musculoskeletal:     Cervical back: Neck supple.     Right lower leg: No edema.     Left lower leg: No edema.  Lymphadenopathy:     Cervical: No cervical adenopathy.  Skin:    Coloration: Skin is not jaundiced.     Findings: No rash.  Neurological:     General: No focal deficit present.     Mental Status: He is alert and oriented to person, place, and time. Mental status is at baseline.     Cranial Nerves: No cranial nerve deficit.     Motor: No weakness.     Gait: Gait normal.  Psychiatric:        Mood and Affect: Mood normal.        Behavior: Behavior normal.        Thought Content: Thought content  normal.        Judgment: Judgment normal.    No results found.  Assessment/plan: Alejandro English is a 58 y.o. male present for Chronic Conditions/illness Management Essential hypertension Stable Continue lisinopril  5 mg daily - dicussed benefits vs risk with ASA 81  mg.  - diet and routine exercise recommended.  - labs UTD-[facility]- reviewed   Osteoporosis /renal Phosphaturia/hypophosphatemia/vitamin D  deficiency Discussion today with patient surrounding his osteoporosis and the ongoing attempt at a diagnosis of cause.  He has now been to two endocrinologist and nephrologist without identifiable cause.   He has received reclast txs- next in Dec 2025 has been scheduled.   Return in about 25 weeks (around 11/18/2023) for Routine chronic condition follow-up.   No orders of the defined types were placed in this encounter.   Meds ordered this encounter  Medications   lisinopril  (ZESTRIL ) 5 MG tablet    Sig: Take 1 tablet (5 mg total) by mouth daily.    Dispense:  90 tablet    Refill:  1    Referral Orders  No referral(s) requested today    Note is dictated utilizing voice recognition software. Although note has been proof read prior to signing, occasional typographical errors still can be missed. If any questions arise, please do not hesitate to call for verification.  Electronically signed by: Napolean Backbone, DO [facility] [place]

## 2023-05-27 NOTE — Patient Instructions (Addendum)
 Return in about 25 weeks (around 11/18/2023) for Routine chronic condition follow-up.        Great to see you today.  I have refilled the medication(s) we provide.   If labs were collected or images ordered, we will inform you of  results once we have received them and reviewed. We will contact you either by echart message, or telephone call.  Please give ample time to the testing facility, and our office to run,  receive and review results. Please do not call inquiring of results, even if you can see them in your chart. We will contact you as soon as we are able. If it has been over 1 week since the test was completed, and you have not yet heard from us , then please call us .    - echart message- for normal results that have been seen by the patient already.   - telephone call: abnormal results or if patient has not viewed results in their echart.  If a referral to a specialist was entered for you, please call us  in 2 weeks if you have not heard from the specialist office to schedule.

## 2023-06-10 ENCOUNTER — Ambulatory Visit
Attending: Thoracic Surgery (Cardiothoracic Vascular Surgery) | Admitting: Thoracic Surgery (Cardiothoracic Vascular Surgery)

## 2023-06-10 DIAGNOSIS — J9859 Other diseases of mediastinum, not elsewhere classified: Secondary | ICD-10-CM | POA: Diagnosis not present

## 2023-06-10 NOTE — Progress Notes (Signed)
     [address]       [name] [address]             [phone]       Patient: Home Provider: Office Consent for Telemedicine visit obtained.  Today's visit was completed via a real-time telehealth (see specific modality noted below). The patient/authorized person provided oral consent at the time of the visit to engage in a telemedicine encounter with the present provider at [facility]. The patient/authorized person was informed of the potential benefits, limitations, and risks of telemedicine. The patient/authorized person expressed understanding that the laws that protect confidentiality also apply to telemedicine. The patient/authorized person acknowledged understanding that telemedicine does not provide emergency services and that he or she would need to call 911 or proceed to the nearest hospital for help if such a need arose.   Total time spent in the clinical discussion 10 minutes.  Telehealth Modality: Phone visit (audio only)  I had a telephone visit with Alejandro English We discussed the results of his tumor markers, which were negative, and PET CT, which showed mild uptake in the nodule.  We discussed options for surgery and 3month surveillance.  The patient would like to do more research on his own, but for now we will plan to follow up in 6 months with another scan.  Kiwanna Spraker Ala Alice

## 2023-06-17 ENCOUNTER — Encounter: Payer: Self-pay | Admitting: Family Medicine

## 2023-06-24 ENCOUNTER — Ambulatory Visit
Attending: Thoracic Surgery (Cardiothoracic Vascular Surgery) | Admitting: Thoracic Surgery (Cardiothoracic Vascular Surgery)

## 2023-06-24 DIAGNOSIS — J9859 Other diseases of mediastinum, not elsewhere classified: Secondary | ICD-10-CM | POA: Diagnosis not present

## 2023-06-24 NOTE — Progress Notes (Signed)
     [address]       [name] [address]             [phone]       Patient: Home Provider: Office Consent for Telemedicine visit obtained.  Today's visit was completed via a real-time telehealth (see specific modality noted below). The patient/authorized person provided oral consent at the time of the visit to engage in a telemedicine encounter with the present provider at [facility]. The patient/authorized person was informed of the potential benefits, limitations, and risks of telemedicine. The patient/authorized person expressed understanding that the laws that protect confidentiality also apply to telemedicine. The patient/authorized person acknowledged understanding that telemedicine does not provide emergency services and that he or she would need to call 911 or proceed to the nearest hospital for help if such a need arose.   Total time spent in the clinical discussion 10 minutes.  Telehealth Modality: Phone visit (audio only)  I had a telephone visit with Alejandro English We rediscussed his labs and images.  He remains uncertain as to how he wants to proceed.  I have recommended that he undergo resection to rule out thymoma.  If he does not want to do that then we should follow this with serial scans.  He will let us  know what he wants to do.

## 2023-06-28 ENCOUNTER — Other Ambulatory Visit: Payer: Self-pay | Admitting: *Deleted

## 2023-06-28 ENCOUNTER — Encounter: Payer: Self-pay | Admitting: *Deleted

## 2023-06-28 DIAGNOSIS — J9859 Other diseases of mediastinum, not elsewhere classified: Secondary | ICD-10-CM

## 2023-07-11 NOTE — Pre-Procedure Instructions (Signed)
 Surgical Instructions   Your procedure is scheduled on July 14, 2023. Report to [facility] Main Entrance "A" at 9:20 A.M., then check in with the Admitting office. Any questions or running late day of surgery: call [phone]  Questions prior to your surgery date: call [phone], Monday-Friday, 8am-4pm. If you experience any cold or flu symptoms such as cough, fever, chills, shortness of breath, etc. between now and your scheduled surgery, please notify us  at the above number.     Remember:  Do not eat or drink after midnight the night before your surgery   Take these medicines the morning of surgery with A SIP OF WATER: Aloe-Sodium Chloride  (AYR SALINE NASAL GEL NA)    One week prior to surgery, STOP taking any Aspirin (unless otherwise instructed by your surgeon) Aleve, Naproxen, Ibuprofen, Motrin, Advil, Goody's, BC's, all herbal medications, fish oil, and non-prescription vitamins.                     Do NOT Smoke (Tobacco/Vaping) for 24 hours prior to your procedure.  If you use a CPAP at night, you may bring your mask/headgear for your overnight stay.   You will be asked to remove any contacts, glasses, piercing's, hearing aid's, dentures/partials prior to surgery. Please bring cases for these items if needed.    Patients discharged the day of surgery will not be allowed to drive home, and someone needs to stay with them for 24 hours.  SURGICAL WAITING ROOM VISITATION Patients may have no more than 2 support people in the waiting area - these visitors may rotate.   Pre-op nurse will coordinate an appropriate time for 1 ADULT support person, who may not rotate, to accompany patient in pre-op.  Children under the age of 35 must have an adult with them who is not the patient and must remain in the main waiting area with an adult.  If the patient needs to stay at the hospital during part of their recovery, the visitor guidelines for inpatient rooms apply.  Please refer to  the [facility] website for the visitor guidelines for any additional information.   If you received a COVID test during your pre-op visit  it is requested that you wear a mask when out in public, stay away from anyone that may not be feeling well and notify your surgeon if you develop symptoms. If you have been in contact with anyone that has tested positive in the last 10 days please notify you surgeon.      Pre-operative CHG Bathing Instructions   You can play a key role in reducing the risk of infection after surgery. Your skin needs to be as free of germs as possible. You can reduce the number of germs on your skin by washing with CHG (chlorhexidine gluconate) soap before surgery. CHG is an antiseptic soap that kills germs and continues to kill germs even after washing.   DO NOT use if you have an allergy to chlorhexidine/CHG or antibacterial soaps. If your skin becomes reddened or irritated, stop using the CHG and notify one of our RNs at [phone].              TAKE A SHOWER THE NIGHT BEFORE SURGERY AND THE DAY OF SURGERY    Please keep in mind the following:  DO NOT shave, including legs and underarms, 48 hours prior to surgery.   You may shave your face before/day of surgery.  Place clean sheets on your bed the  night before surgery Use a clean washcloth (not used since being washed) for each shower. DO NOT sleep with pet's night before surgery.  CHG Shower Instructions:  Wash your face and private area with normal soap. If you choose to wash your hair, wash first with your normal shampoo.  After you use shampoo/soap, rinse your hair and body thoroughly to remove shampoo/soap residue.  Turn the water OFF and apply half the bottle of CHG soap to a CLEAN washcloth.  Apply CHG soap ONLY FROM YOUR NECK DOWN TO YOUR TOES (washing for 3-5 minutes)  DO NOT use CHG soap on face, private areas, open wounds, or sores.  Pay special attention to the area where your surgery is being  performed.  If you are having back surgery, having someone wash your back for you may be helpful. Wait 2 minutes after CHG soap is applied, then you may rinse off the CHG soap.  Pat dry with a clean towel  Put on clean pajamas    Additional instructions for the day of surgery: DO NOT APPLY any lotions, deodorants, cologne, or perfumes.   Do not wear jewelry or makeup Do not wear nail polish, gel polish, artificial nails, or any other type of covering on natural nails (fingers and toes) Do not bring valuables to the hospital. [facility] is not responsible for valuables/personal belongings. Put on clean/comfortable clothes.  Please brush your teeth.  Ask your nurse before applying any prescription medications to the skin.

## 2023-07-12 ENCOUNTER — Encounter (HOSPITAL_COMMUNITY): Payer: Self-pay

## 2023-07-12 ENCOUNTER — Other Ambulatory Visit: Payer: Self-pay

## 2023-07-12 ENCOUNTER — Ambulatory Visit (HOSPITAL_COMMUNITY)
Admission: RE | Admit: 2023-07-12 | Discharge: 2023-07-12 | Disposition: A | Discharge status: Home / Self Care | Source: Ambulatory Visit | Attending: Thoracic Surgery (Cardiothoracic Vascular Surgery) | Admitting: Thoracic Surgery (Cardiothoracic Vascular Surgery)

## 2023-07-12 ENCOUNTER — Encounter (HOSPITAL_COMMUNITY)
Admission: RE | Admit: 2023-07-12 | Discharge: 2023-07-12 | Disposition: A | Discharge status: Home / Self Care | Source: Ambulatory Visit | Attending: Thoracic Surgery (Cardiothoracic Vascular Surgery) | Admitting: Thoracic Surgery (Cardiothoracic Vascular Surgery)

## 2023-07-12 VITALS — BP 139/94 | HR 58 | Temp 98.2°F | Resp 18 | Ht 70.0 in | Wt 178.0 lb

## 2023-07-12 DIAGNOSIS — J9859 Other diseases of mediastinum, not elsewhere classified: Secondary | ICD-10-CM | POA: Diagnosis not present

## 2023-07-12 DIAGNOSIS — Z01818 Encounter for other preprocedural examination: Secondary | ICD-10-CM | POA: Insufficient documentation

## 2023-07-12 HISTORY — DX: Pneumonia, unspecified organism: J18.9

## 2023-07-12 LAB — PROTIME-INR
INR: 1 (ref 0.8–1.2)
Prothrombin Time: 13.4 s (ref 11.4–15.2)

## 2023-07-12 LAB — CBC
HCT: 49.4 % (ref 39.0–52.0)
Hemoglobin: 16.6 g/dL (ref 13.0–17.0)
MCH: 28.9 pg (ref 26.0–34.0)
MCHC: 33.6 g/dL (ref 30.0–36.0)
MCV: 86.1 fL (ref 80.0–100.0)
Platelets: 163 10*3/uL (ref 150–400)
RBC: 5.74 MIL/uL (ref 4.22–5.81)
RDW: 12.2 % (ref 11.5–15.5)
WBC: 5 10*3/uL (ref 4.0–10.5)
nRBC: 0 % (ref 0.0–0.2)

## 2023-07-12 LAB — SURGICAL PCR SCREEN

## 2023-07-12 LAB — COMPREHENSIVE METABOLIC PANEL WITH GFR
ALT: 21 U/L (ref 0–44)
AST: 23 U/L (ref 15–41)
Albumin: 4 g/dL (ref 3.5–5.0)
Alkaline Phosphatase: 55 U/L (ref 38–126)
Anion gap: 7 (ref 5–15)
BUN: 15 mg/dL (ref 6–20)
CO2: 28 mmol/L (ref 22–32)
Calcium: 9.2 mg/dL (ref 8.9–10.3)
Chloride: 104 mmol/L (ref 98–111)
Creatinine, Ser: 0.94 mg/dL (ref 0.61–1.24)
GFR, Estimated: >60 mL/min (ref >60)
Glucose, Bld: 106 mg/dL — ABNORMAL HIGH (ref 70–99)
Potassium: 4.5 mmol/L (ref 3.5–5.1)
Sodium: 139 mmol/L (ref 135–145)
Total Bilirubin: 0.7 mg/dL (ref 0.0–1.2)
Total Protein: 7.2 g/dL (ref 6.5–8.1)

## 2023-07-12 LAB — TYPE AND SCREEN
ABO/RH(D): O POS
Antibody Screen: NEGATIVE

## 2023-07-12 LAB — APTT: aPTT: 28 s (ref 24–36)

## 2023-07-12 LAB — URINALYSIS, ROUTINE W REFLEX MICROSCOPIC
Bilirubin Urine: NEGATIVE
Glucose, UA: NEGATIVE mg/dL
Hgb urine dipstick: NEGATIVE
Ketones, ur: NEGATIVE mg/dL
Leukocytes,Ua: NEGATIVE
Nitrite: NEGATIVE
Protein, ur: NEGATIVE mg/dL
Specific Gravity, Urine: 1.011 (ref 1.005–1.030)
pH: 5 (ref 5.0–8.0)

## 2023-07-12 NOTE — Progress Notes (Addendum)
 PCP - Napolean Backbone, DO Cardiologist - Denies  PPM/ICD - Denies Device Orders - n/a Rep Notified - n/a  Chest x-ray - 07/12/2023 EKG - 07/12/2023 Stress Test - Denies ECHO - Denies Cardiac Cath - Denies  Sleep Study - Denies CPAP - n/a  No DM  Last dose of GLP1 agonist- n/a GLP1 instructions: n/a  Blood Thinner Instructions: n/a Aspirin Instructions: n/a  NPO after midnight  COVID TEST- n/a   Anesthesia review: Yes. EKG review  Patient denies shortness of breath, fever, cough and chest pain at PAT appointment. Pt denies any respiratory illness/infection in the last two months.   All instructions explained to the patient, with a verbal understanding of the material. Patient agrees to go over the instructions while at home for a better understanding. Patient also instructed to self quarantine after being tested for COVID-19. The opportunity to ask questions was provided.

## 2023-07-12 NOTE — Progress Notes (Signed)
 Surgical PCR result invalid that was collected at PAT appointment. Will need to be recollected DOS. Order placed

## 2023-07-13 ENCOUNTER — Encounter (HOSPITAL_COMMUNITY): Payer: Self-pay | Admitting: Physician Assistant

## 2023-07-13 ENCOUNTER — Other Ambulatory Visit: Payer: Self-pay | Admitting: *Deleted

## 2023-07-13 ENCOUNTER — Telehealth: Payer: Self-pay

## 2023-07-13 NOTE — Telephone Encounter (Signed)
 Copied from CRM [id number]. Topic: General - Other >> Jul 13, 2023  2:18 PM Adonis Hoot wrote: Reason for CRM: Cardiac Thoracic surgeon that he  was referred to Dr Deloise Ferries called him and canceled the surgery that he was going to have with him.Patient would like to know if another referral could be sent out to another cardiac thoracis surgeon? He is also requesting a phone call to discuss.

## 2023-07-13 NOTE — Telephone Encounter (Signed)
 Pt states that provider felt uncomfortable and believed that he was not the right dr for him because pt had lots questions about pre op results. Pt states that it does not appear he needs a referral so he is looking online though his insurance portal as well.

## 2023-07-14 ENCOUNTER — Encounter (HOSPITAL_COMMUNITY): Admission: RE | Payer: Self-pay | Source: Home / Self Care

## 2023-07-14 ENCOUNTER — Inpatient Hospital Stay (HOSPITAL_COMMUNITY)
Admission: RE | Admit: 2023-07-14 | Source: Home / Self Care | Admitting: Thoracic Surgery (Cardiothoracic Vascular Surgery)

## 2023-07-14 SURGERY — EXCISION, MASS, MEDIASTINUM, ROBOT-ASSISTED
Anesthesia: General | Laterality: Right

## 2023-08-12 ENCOUNTER — Telehealth: Payer: Self-pay

## 2023-08-12 NOTE — Transitions of Care (Post Inpatient/ED Visit) (Signed)
   08/12/2023  Name: Alejandro English MRN: [id number] DOB: May 19, 1965  Today's TOC FU Call Status: Today's TOC FU Call Status:: Successful TOC FU Call Completed TOC FU Call Complete Date: 08/12/23 Patient's Name and Date of Birth confirmed.  Transition Care Management Follow-up Telephone Call Date of Discharge: 08/11/23 Discharge Facility: Other [facility]) Name of Other (Non[facility]) Discharge Facility: [facility] Type of Discharge: Inpatient Admission Primary Inpatient Discharge Diagnosis:: mediastinal mass How have you been since you were released from the hospital?: Better Any questions or concerns?: No  Items Reviewed: Did you receive and understand the discharge instructions provided?: Yes Medications obtained,verified, and reconciled?: Yes (Medications Reviewed) Any new allergies since your discharge?: No Dietary orders reviewed?: Yes Do you have support at home?: Yes People in Home [RPT]: parent(s), sibling(s)  Medications Reviewed Today: Medications Reviewed Today     Reviewed by Emmitt Pan, LPN (Licensed Practical Nurse) on 08/12/23 at 1131  Med List Status: <None>   Medication Order Taking? Sig Documenting Provider Last Dose Status Informant  Aloe-Sodium Chloride  (AYR SALINE NASAL GEL NA) [id number]  Place 1 spray into the nose daily. [provider]  Active Self  Cholecalciferol (VITAMIN D3) 50 MCG (2000 UT) CAPS [id number] No Take 2,000 Units by mouth daily. [provider] Taking Active Self  lisinopril  (ZESTRIL ) 5 MG tablet [id number]  Take 1 tablet (5 mg total) by mouth daily. Catherine Charlies LABOR, DO  Active Self  OVER THE COUNTER MEDICATION [id number]  Apply 1 Application topically daily. zocular zocufoam eyelid cleanser [provider]  Active Self  Sodium Fluoride (SODIUM FLUORIDE 5000 PPM) 1.1 % PSTE [id number]  Place 1 Application onto teeth at bedtime. [provider]  Active Self  zoledronic acid (RECLAST) 5 MG/100ML SOLN  injection [id number] No Inject 5 mg into the vein. yearly [provider] Taking Active Self            Home Care and Equipment/Supplies:    Functional Questionnaire:    Follow up appointments reviewed: PCP Follow-up appointment confirmed?: No (declined) MD Provider Line [phone] Given: No Specialist Hospital Follow-up appointment confirmed?: Yes Date of Specialist follow-up appointment?: 08/22/23 Follow-Up Specialty Provider:: surgery Do you need transportation to your follow-up appointment?: No Do you understand care options if your condition(s) worsen?: Yes-patient verbalized understanding    SIGNATURE Pan Emmitt, LPN [facility] Nurse Health Advisor Direct Dial [phone]

## 2023-08-24 ENCOUNTER — Telehealth: Payer: Self-pay

## 2023-08-24 NOTE — Telephone Encounter (Signed)
 Pt is requesting to transfer FROM: Charlies Bellini, DO  Pt is requesting to transfer TO: Cleatus Specking, MD  Reason for requested transfer: Patient was seeking a new PCP, impressed with Dr. Deadra credentials.  It is the responsibility of the team the patient would like to transfer to (Dr. Cleatus Specking) to reach out to the patient if for any reason this transfer is not acceptable.   Okay for transfer. Call to patient not needed.

## 2023-09-01 ENCOUNTER — Encounter: Payer: Self-pay | Admitting: Student in an Organized Health Care Education/Training Program

## 2023-09-06 ENCOUNTER — Encounter: Payer: Self-pay | Admitting: Student in an Organized Health Care Education/Training Program

## 2023-09-06 ENCOUNTER — Ambulatory Visit: Admitting: Student in an Organized Health Care Education/Training Program

## 2023-09-06 VITALS — BP 129/76 | HR 59 | Wt 181.0 lb

## 2023-09-06 DIAGNOSIS — M818 Other osteoporosis without current pathological fracture: Secondary | ICD-10-CM | POA: Diagnosis not present

## 2023-09-06 DIAGNOSIS — M722 Plantar fascial fibromatosis: Secondary | ICD-10-CM

## 2023-09-06 DIAGNOSIS — N400 Enlarged prostate without lower urinary tract symptoms: Secondary | ICD-10-CM | POA: Diagnosis not present

## 2023-09-06 DIAGNOSIS — I10 Essential (primary) hypertension: Secondary | ICD-10-CM

## 2023-09-06 MED ORDER — LISINOPRIL 5 MG PO TABS: 5.0000 mg | ORAL_TABLET | Freq: Every day | ORAL | 1 refills | Status: DC

## 2023-09-06 NOTE — Progress Notes (Signed)
 Established Patient Office Visit  Subjective   Patient ID: Alejandro English, male    DOB: 09/07/1965  Age: 58 y.o. MRN: [id number]  Chief Complaint  Patient presents with   Transitions Of Care    Patient would like to discuss pneumococcal vaccine.     HPI  Discussed the use of AI scribe software for clinical note transcription with the patient, who gave verbal consent to proceed.  History of Present Illness ANTHONEE GELIN is a 58 year old male who presents for a transfer of care and follow-up after recent surgery for a mediastinal mass.  He initially discovered the mediastinal mass during a executive physical CT scan last fall, which led to further testing including a CT with contrast and a PET scan to rule out lymphatic cancer. The mass was identified as a mediastinal mass, and due to concerns about potential thymoma, a biopsy was not performed. He underwent surgery on July 9th, where the mass was excised, and the final diagnosis was benign. He is experiencing ongoing recovery with some numbness and a lift restriction, and did not require a sternotomy during the surgery.  He has a history of hypertension, managed with lisinopril  5 mg daily, which was reduced from 10 mg due to episodes of feeling faint. No history of heart attacks or strokes.  He was diagnosed with osteoporosis four years ago following a random bone density scan. He receives annual Reclast infusions and takes a daily vitamin D3 supplement of 2000 units due to a vitamin D  deficiency. Despite seeing multiple specialists, the cause of the deficiency remains unknown.  He reports an enlarged prostate discovered during a PET scan, but his PSA levels have been normal. He experienced urinary retention post-surgery 20 years ago, which required catheterization, and was managed with Flomax, which he no longer takes.  He experiences right heel pain, which began before his recent surgery and has prevented him from running. He suspects  it may be related to a corn diagnosed by his dermatologist. He was previously running three to four miles a day but has since gained weight due to reduced activity.  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 He lives alone, is estranged from his adult children, and does not consume alcohol or tobacco. He has recently joined a church and is working on improving his sleep habits.      Objective:     BP 129/76   Pulse (!) 59   Wt 181 lb (82.1 kg)   BMI 25.97 kg/m    Physical Exam  Gen: Well-appearing man Eyes: Normal Ears: Normal tympanic membranes bilaterally Neck: Normal thyroid , no nodules or adenopathy Heart: Regular, no murmur Lungs: Unlabored, clear throughout Chest: Well-healing surgical incisions on the left side of his chest from VATS Abd: Soft, nontender, no organomegaly Ext: Warm, well-perfused, no edema, normal DP pulses in the right foot, he has tenderness to palpation of the plantar fascia over the calcaneus Psych: Appropriate mood and affect, not anxious or depressed appearing Neuro: Alert, conversational, full strength upper and lower extremities     Assessment & Plan:   Problem List Items Addressed This Visit       High   Essential hypertension - Primary (Chronic)   Chronic and stable.  Blood pressure well-controlled today.  Tolerating lisinopril  5 mg daily well.  Had some lightheadedness when he was at 10 mg daily.  Will refill lisinopril .      Relevant Medications   lisinopril  (ZESTRIL ) 5 MG tablet     Medium    Osteoporosis without current pathological fracture (Chronic)   Chronic and stable.  Being managed with endocrinology.  Currently getting zoledronic acid infusions once yearly.        Low   Enlarged prostate without lower urinary tract symptoms (luts) (Chronic)   Incidental finding of an  enlarged prostate on a PET CT scan.  Normal PSA values.  No lower urinary tract symptoms currently.  Has had some issues with urine retention postanesthesia.  Currently not using Flomax and doing well.      Plantar fasciitis of right foot (Chronic)   Acute issue.  Symptoms and exam are consistent with plantar fasciitis of the right foot.  Symptoms are moderate.  At times limiting exercise.  We talked about supportive care.  I gave him written instructions on exercises for plantar fasciitis.  Recommended safe use of anti-inflammatories like ibuprofen and topical Voltaren which is over-the-counter.  If symptoms worsen or do not improve, would recommend customized orthotics.       Return in about 3 months (around 12/07/2023).   I spent 30 minutes with the patient conducting history taking, physical examination, and medical decision making. This time was medically necessary given the complexity of the patient's concerns and conditions.     Cleatus Debby Specking, MD

## 2023-09-06 NOTE — Patient Instructions (Signed)
  VISIT SUMMARY: Today, you came in for a transfer of care and follow-up after your recent surgery for a mediastinal mass. We discussed your ongoing recovery, management of your hypertension, osteoporosis, plantar fasciitis, vitamin D  deficiency, and benign prostatic hyperplasia.  YOUR PLAN: -BENIGN MEDIASTINAL MASS, STATUS POST SURGICAL EXCISION: A benign mediastinal mass is a non-cancerous growth in the area between your lungs. You are recovering from the surgery to remove this mass and experiencing some chest numbness. Continue your post-operative recovery and follow up with your surgical team as needed.  -HYPERTENSION: Hypertension is high blood pressure. Your condition is managed with lisinopril  5 mg daily, as higher doses previously caused faintness. Continue taking lisinopril  5 mg daily and monitor your blood pressure regularly. We will reassess your blood pressure management in three months.  -OSTEOPOROSIS: Osteoporosis is a condition where bones become weak and brittle. Your condition is managed with annual Reclast infusions. Continue with your annual Reclast infusions and follow up with an endocrinologist as needed.  -PLANTAR FASCIITIS, RIGHT FOOT: Plantar fasciitis is inflammation of the tissue on the bottom of your foot, causing heel pain. This is likely due to overuse from running. Perform the exercises and stretches provided for plantar fasciitis. If symptoms persist, consider custom orthotics and we may refer you to a podiatrist.  -VITAMIN D  DEFICIENCY: Vitamin D  deficiency means you have lower than normal levels of vitamin D , which is important for bone health. Continue taking vitamin D3 2000 IU daily.  -BENIGN PROSTATIC HYPERPLASIA: Benign prostatic hyperplasia is an enlarged prostate gland. Your PSA levels are normal, and you previously managed urinary retention with Flomax, which you no longer take. Monitor your urinary symptoms and PSA levels. If urinary symptoms recur, we may  consider reinitiating Flomax.  INSTRUCTIONS: Follow up with your surgical team as needed for your post-operative recovery. Monitor your blood pressure regularly and follow up in three months to reassess your hypertension management. Continue your annual Reclast infusions for osteoporosis and follow up with an endocrinologist as needed. Perform the exercises and stretches for plantar fasciitis, and consider custom orthotics if symptoms persist. Continue taking vitamin D3 2000 IU daily. Monitor your urinary symptoms and PSA levels, and consider reinitiating Flomax if urinary symptoms recur.

## 2023-09-06 NOTE — Assessment & Plan Note (Signed)
 Chronic and stable.  Blood pressure well-controlled today.  Tolerating lisinopril  5 mg daily well.  Had some lightheadedness when he was at 10 mg daily.  Will refill lisinopril .

## 2023-09-06 NOTE — Assessment & Plan Note (Signed)
 Chronic and stable.  Being managed with endocrinology.  Currently getting zoledronic acid infusions once yearly.

## 2023-09-06 NOTE — Assessment & Plan Note (Addendum)
 Acute issue.  Symptoms and exam are consistent with plantar fasciitis of the right foot.  Symptoms are moderate.  At times limiting exercise.  We talked about supportive care.  I gave him written instructions on exercises for plantar fasciitis.  Recommended safe use of anti-inflammatories like ibuprofen and topical Voltaren which is over-the-counter.  If symptoms worsen or do not improve, would recommend customized orthotics.

## 2023-09-06 NOTE — Assessment & Plan Note (Signed)
 Incidental finding of an enlarged prostate on a PET CT scan.  Normal PSA values.  No lower urinary tract symptoms currently.  Has had some issues with urine retention postanesthesia.  Currently not using Flomax and doing well.

## 2023-11-18 ENCOUNTER — Ambulatory Visit: Admitting: Family Medicine

## 2023-12-15 ENCOUNTER — Ambulatory Visit: Admitting: Student in an Organized Health Care Education/Training Program

## 2023-12-16 ENCOUNTER — Encounter: Payer: Self-pay | Admitting: Student in an Organized Health Care Education/Training Program

## 2023-12-16 ENCOUNTER — Ambulatory Visit: Admitting: Student in an Organized Health Care Education/Training Program

## 2023-12-16 VITALS — BP 124/81 | HR 58 | Wt 185.0 lb

## 2023-12-16 DIAGNOSIS — I10 Essential (primary) hypertension: Secondary | ICD-10-CM | POA: Diagnosis not present

## 2023-12-16 DIAGNOSIS — I6789 Other cerebrovascular disease: Secondary | ICD-10-CM | POA: Insufficient documentation

## 2023-12-16 MED ORDER — LISINOPRIL 5 MG PO TABS: 5.0000 mg | ORAL_TABLET | Freq: Every day | ORAL | 3 refills | Status: AC

## 2023-12-16 NOTE — Progress Notes (Signed)
   Established Patient Office Visit  Patient ID: Alejandro English, male    DOB: [birth year]  Age: 58 y.o. MRN: [id number] PCP: English Alejandro Ned, MD  Chief Complaint  Patient presents with   Hypertension    3 month follow up     Subjective:     HPI  Discussed the use of AI scribe software for clinical note transcription with the patient, who gave verbal consent to proceed.  History of Present Illness Alejandro English is a 58 year old male who presents for follow-up after an executive physical.  He underwent an executive physical at [facility], which included a comprehensive blood panel and a brain MRI. The MRI revealed chronic microvascular changes, and he subsequently underwent further testing with contrast. His blood work showed elevated LDL levels, with LDL at 97, HDL at 54, and J8r at 5.5. He has started taking rosuvastatin 5 mg three times a week (Monday, Wednesday, Friday) and CoQ10 daily.  He has a history of plantar fasciitis, which was exacerbated by his exercise routine. He has modified his workouts to avoid excessive strain, focusing on maintaining a heart rate in zone two for three hours a week. He is trying to adhere to this new exercise regimen despite a demanding work schedule.  He has a history of osteoporosis and is cautious about activities that may increase his risk of fractures. Despite this, he continues to ride motorcycles, following strict personal safety rules to mitigate risks.  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 Despite personal and professional stressors, including family estrangement and a recent separation from his wife, he maintains a positive outlook and does not feel the need for mood medication. He experiences occasional joint soreness, particularly in the knees, and morning stiffness.  He has become active in a principal financial, which provides him with social support and engagement outside of work.      Objective:     BP 124/81   Pulse (!) 58   Wt 185 lb (83.9 kg)   SpO2 100%   BMI 26.54 kg/m   Physical Exam  Gen: Well-appearing man Neck: Normal thyroid , no nodules or adenopathy Heart: Regular, no murmur Lungs: Unlabored, clear throughout Ext: Warm, no edema    Assessment & Plan:   Problem List Items Addressed This Visit       High   Essential hypertension - Primary (Chronic)   Blood pressure is well-controlled at 124/81 mmHg. Continue Lisinopril  5 mg orally daily. Lisinopril  refill is ensured.      Relevant Medications   rosuvastatin (CRESTOR) 5 MG tablet   ASPIRIN 81 PO   lisinopril  (ZESTRIL ) 5 MG tablet   Cerebral microvascular disease (Chronic)   MRI completed during an executive physical showed chronic changes with no acute symptoms that were thought to be due to chronic microvascular changes. LDL is slightly elevated at 97 mg/dL.  He has had no history of acute ischemic events.  He was started on rosuvastatin 5 mg orally three times a week which I think sounds reasonable.  He is having no myalgias from this and tolerating it well.      Relevant Medications   rosuvastatin (CRESTOR) 5 MG tablet   ASPIRIN 81 PO   lisinopril  (ZESTRIL ) 5 MG tablet    Return in about 6 months (around 06/14/2024).    Alejandro Ned Jerrell, MD [facility]

## 2023-12-16 NOTE — Assessment & Plan Note (Signed)
 MRI completed during an executive physical showed chronic changes with no acute symptoms that were thought to be due to chronic microvascular changes. LDL is slightly elevated at 97 mg/dL.  He has had no history of acute ischemic events.  He was started on rosuvastatin 5 mg orally three times a week which I think sounds reasonable.  He is having no myalgias from this and tolerating it well.

## 2023-12-16 NOTE — Patient Instructions (Signed)
  VISIT SUMMARY: You came in today for a follow-up after your executive physical. Your recent tests included a comprehensive blood panel and a brain MRI, which showed some chronic changes. Your blood work indicated slightly elevated LDL levels. You have started taking rosuvastatin and CoQ10. We also discussed your history of plantar fasciitis, osteoporosis, and occasional anxiety. You are managing these conditions with lifestyle modifications and medications. You are also active in your local church community, which provides you with social support.  YOUR PLAN: -PRIMARY ESSENTIAL HYPERTENSION: Your blood pressure is well-controlled at 124/81 mmHg. Continue taking Lisinopril  5 mg daily to manage your blood pressure.  -CHRONIC MICROVASCULAR BRAIN CHANGES: This condition involves small blood vessels in the brain showing chronic changes. Your LDL is slightly elevated, so continue taking rosuvastatin 5 mg three times a week. You can stop taking CoQ10 unless you experience muscle soreness.  -OTHER OSTEOPOROSIS WITHOUT CURRENT PATHOLOGICAL FRACTURE: Osteoporosis is a condition where bones become weak and brittle. You are managing this with an annual Reclast IV. Continue with this treatment as planned.  -PLANTAR FASCIITIS OF RIGHT FOOT: Plantar fasciitis is inflammation of the tissue on the bottom of your foot. Continue with your strength training and zone 2 cardio exercises as recommended by your physiologist.  -GENERAL HEALTH MAINTENANCE: You received a flu shot and are engaging in regular physical activity. Continue with your regular physical activities and maintain a positive outlook with supportive activities.  INSTRUCTIONS: Please continue with your current medications and lifestyle modifications. Follow up with any new symptoms or concerns. Continue your annual Reclast IV for osteoporosis and your strength training and cardio exercises for plantar fasciitis. If you experience muscle soreness, you may  resume taking CoQ10.

## 2023-12-16 NOTE — Assessment & Plan Note (Signed)
 Blood pressure is well-controlled at 124/81 mmHg. Continue Lisinopril  5 mg orally daily. Lisinopril  refill is ensured.

## 2024-06-14 ENCOUNTER — Ambulatory Visit: Admitting: Student in an Organized Health Care Education/Training Program
